# Patient Record
Sex: Male | Born: 1965 | Race: White | Hispanic: No | Marital: Married | Smoking: Former smoker
Health system: Southern US, Community
[De-identification: ages and names within clinical notes are randomized; demographics above are authoritative.]

## PROBLEM LIST (undated history)

## (undated) DIAGNOSIS — I739 Peripheral vascular disease, unspecified: Secondary | ICD-10-CM

## (undated) DIAGNOSIS — M1990 Unspecified osteoarthritis, unspecified site: Secondary | ICD-10-CM

## (undated) DIAGNOSIS — G4730 Sleep apnea, unspecified: Secondary | ICD-10-CM

---

## 1988-10-26 HISTORY — PX: ROTATOR CUFF REPAIR: SHX139

## 2018-11-03 ENCOUNTER — Emergency Department (HOSPITAL_COMMUNITY): Payer: BLUE CROSS/BLUE SHIELD

## 2018-11-03 ENCOUNTER — Inpatient Hospital Stay (HOSPITAL_COMMUNITY): Payer: BLUE CROSS/BLUE SHIELD

## 2018-11-03 ENCOUNTER — Inpatient Hospital Stay (HOSPITAL_COMMUNITY)
Admission: EM | Admit: 2018-11-03 | Discharge: 2018-11-09 | DRG: 418 | Disposition: A | Discharge status: Home / Self Care | Payer: BLUE CROSS/BLUE SHIELD | Attending: Internal Medicine | Admitting: Internal Medicine

## 2018-11-03 ENCOUNTER — Encounter (HOSPITAL_COMMUNITY): Payer: Self-pay

## 2018-11-03 ENCOUNTER — Other Ambulatory Visit: Payer: Self-pay

## 2018-11-03 DIAGNOSIS — K859 Acute pancreatitis without necrosis or infection, unspecified: Secondary | ICD-10-CM

## 2018-11-03 DIAGNOSIS — E876 Hypokalemia: Secondary | ICD-10-CM | POA: Diagnosis not present

## 2018-11-03 DIAGNOSIS — Z87891 Personal history of nicotine dependence: Secondary | ICD-10-CM | POA: Diagnosis not present

## 2018-11-03 DIAGNOSIS — K8020 Calculus of gallbladder without cholecystitis without obstruction: Secondary | ICD-10-CM

## 2018-11-03 DIAGNOSIS — Z6841 Body Mass Index (BMI) 40.0 and over, adult: Secondary | ICD-10-CM

## 2018-11-03 DIAGNOSIS — K851 Biliary acute pancreatitis without necrosis or infection: Principal | ICD-10-CM | POA: Diagnosis present

## 2018-11-03 DIAGNOSIS — K219 Gastro-esophageal reflux disease without esophagitis: Secondary | ICD-10-CM | POA: Diagnosis present

## 2018-11-03 DIAGNOSIS — R252 Cramp and spasm: Secondary | ICD-10-CM | POA: Diagnosis present

## 2018-11-03 DIAGNOSIS — K8000 Calculus of gallbladder with acute cholecystitis without obstruction: Secondary | ICD-10-CM | POA: Diagnosis not present

## 2018-11-03 DIAGNOSIS — R1011 Right upper quadrant pain: Secondary | ICD-10-CM | POA: Diagnosis present

## 2018-11-03 DIAGNOSIS — M1990 Unspecified osteoarthritis, unspecified site: Secondary | ICD-10-CM | POA: Diagnosis present

## 2018-11-03 DIAGNOSIS — K567 Ileus, unspecified: Secondary | ICD-10-CM | POA: Diagnosis not present

## 2018-11-03 DIAGNOSIS — R7989 Other specified abnormal findings of blood chemistry: Secondary | ICD-10-CM

## 2018-11-03 DIAGNOSIS — R109 Unspecified abdominal pain: Secondary | ICD-10-CM

## 2018-11-03 DIAGNOSIS — Z419 Encounter for procedure for purposes other than remedying health state, unspecified: Secondary | ICD-10-CM

## 2018-11-03 DIAGNOSIS — R945 Abnormal results of liver function studies: Secondary | ICD-10-CM | POA: Diagnosis not present

## 2018-11-03 LAB — URINALYSIS, ROUTINE W REFLEX MICROSCOPIC
BILIRUBIN URINE: NEGATIVE
Glucose, UA: NEGATIVE mg/dL
Hgb urine dipstick: NEGATIVE
Ketones, ur: NEGATIVE mg/dL
Leukocytes, UA: NEGATIVE
Nitrite: NEGATIVE
Protein, ur: NEGATIVE mg/dL
Specific Gravity, Urine: 1.023 (ref 1.005–1.030)
pH: 5 (ref 5.0–8.0)

## 2018-11-03 LAB — COMPREHENSIVE METABOLIC PANEL
ALT: 85 U/L — ABNORMAL HIGH (ref 0–44)
AST: 104 U/L — ABNORMAL HIGH (ref 15–41)
Albumin: 4 g/dL (ref 3.5–5.0)
Alkaline Phosphatase: 58 U/L (ref 38–126)
Anion gap: 6 (ref 5–15)
BUN: 23 mg/dL — ABNORMAL HIGH (ref 6–20)
CO2: 23 mmol/L (ref 22–32)
Calcium: 8.7 mg/dL — ABNORMAL LOW (ref 8.9–10.3)
Chloride: 110 mmol/L (ref 98–111)
Creatinine, Ser: 0.83 mg/dL (ref 0.61–1.24)
GFR calc non Af Amer: >60 mL/min (ref >60)
Glucose, Bld: 156 mg/dL — ABNORMAL HIGH (ref 70–99)
Potassium: 3.9 mmol/L (ref 3.5–5.1)
Sodium: 139 mmol/L (ref 135–145)
Total Bilirubin: 1.1 mg/dL (ref 0.3–1.2)
Total Protein: 6.8 g/dL (ref 6.5–8.1)

## 2018-11-03 LAB — CBC
HCT: 46.1 % (ref 39.0–52.0)
HEMATOCRIT: 46.4 % (ref 39.0–52.0)
Hemoglobin: 15.1 g/dL (ref 13.0–17.0)
Hemoglobin: 14.9 g/dL (ref 13.0–17.0)
MCH: 30.8 pg (ref 26.0–34.0)
MCH: 31.2 pg (ref 26.0–34.0)
MCHC: 32.8 g/dL (ref 30.0–36.0)
MCHC: 32.1 g/dL (ref 30.0–36.0)
MCV: 93.9 fL (ref 80.0–100.0)
MCV: 97.3 fL (ref 80.0–100.0)
Platelets: 221 10*3/uL (ref 150–400)
Platelets: 212 10*3/uL (ref 150–400)
RBC: 4.91 MIL/uL (ref 4.22–5.81)
RBC: 4.77 MIL/uL (ref 4.22–5.81)
RDW: 13.2 % (ref 11.5–15.5)
RDW: 13.3 % (ref 11.5–15.5)
WBC: 7.5 10*3/uL (ref 4.0–10.5)
WBC: 10.2 10*3/uL (ref 4.0–10.5)
nRBC: 0 % (ref 0.0–0.2)
nRBC: 0 % (ref 0.0–0.2)

## 2018-11-03 LAB — CREATININE, SERUM
Creatinine, Ser: 0.81 mg/dL (ref 0.61–1.24)
GFR calc Af Amer: >60 mL/min (ref >60)
GFR calc non Af Amer: >60 mL/min (ref >60)

## 2018-11-03 LAB — LIPASE, BLOOD: Lipase: 6447 U/L — ABNORMAL HIGH (ref 11–51)

## 2018-11-03 MED ORDER — SODIUM CHLORIDE (PF) 0.9 % IJ SOLN
INTRAMUSCULAR | Status: AC
  Filled 2018-11-03: qty 50

## 2018-11-03 MED ORDER — IBUPROFEN 400 MG PO TABS
400.0000 mg | ORAL_TABLET | Freq: Four times a day (QID) | ORAL | Status: DC | PRN
  Administered 2018-11-03 – 2018-11-07 (×3): 400 mg via ORAL
  Filled 2018-11-03 (×3): qty 1

## 2018-11-03 MED ORDER — ACETAMINOPHEN 325 MG PO TABS
650.0000 mg | ORAL_TABLET | Freq: Four times a day (QID) | ORAL | Status: DC | PRN
  Administered 2018-11-08: 650 mg via ORAL
  Filled 2018-11-03: qty 2

## 2018-11-03 MED ORDER — ACETAMINOPHEN 650 MG RE SUPP: 650.0000 mg | Freq: Four times a day (QID) | RECTAL | Status: DC | PRN

## 2018-11-03 MED ORDER — LIP MEDEX EX OINT
TOPICAL_OINTMENT | CUTANEOUS | Status: AC
  Administered 2018-11-03: 1
  Filled 2018-11-03: qty 7

## 2018-11-03 MED ORDER — ONDANSETRON HCL 4 MG PO TABS
4.0000 mg | ORAL_TABLET | Freq: Four times a day (QID) | ORAL | Status: DC | PRN
  Filled 2018-11-03: qty 1

## 2018-11-03 MED ORDER — LACTATED RINGERS IV BOLUS
1000.0000 mL | Freq: Once | INTRAVENOUS | Status: AC
  Administered 2018-11-03: 1000 mL via INTRAVENOUS

## 2018-11-03 MED ORDER — TRAMADOL HCL 50 MG PO TABS
50.0000 mg | ORAL_TABLET | Freq: Four times a day (QID) | ORAL | Status: DC | PRN
  Administered 2018-11-03 – 2018-11-07 (×3): 50 mg via ORAL
  Filled 2018-11-03 (×3): qty 1

## 2018-11-03 MED ORDER — LIDOCAINE VISCOUS HCL 2 % MT SOLN
15.0000 mL | Freq: Once | OROMUCOSAL | Status: AC
  Administered 2018-11-03: 15 mL via ORAL
  Filled 2018-11-03: qty 15

## 2018-11-03 MED ORDER — SODIUM CHLORIDE 0.9 % IV SOLN
Freq: Once | INTRAVENOUS | Status: AC
  Administered 2018-11-03: 11:00:00 via INTRAVENOUS

## 2018-11-03 MED ORDER — ALUM & MAG HYDROXIDE-SIMETH 200-200-20 MG/5ML PO SUSP
30.0000 mL | Freq: Once | ORAL | Status: AC
  Administered 2018-11-03: 30 mL via ORAL
  Filled 2018-11-03: qty 30

## 2018-11-03 MED ORDER — ONDANSETRON HCL 4 MG/2ML IJ SOLN
4.0000 mg | Freq: Four times a day (QID) | INTRAMUSCULAR | Status: DC | PRN
  Administered 2018-11-03 – 2018-11-06 (×3): 4 mg via INTRAVENOUS
  Filled 2018-11-03 (×4): qty 2

## 2018-11-03 MED ORDER — SODIUM CHLORIDE 0.9 % IV SOLN
INTRAVENOUS | Status: DC
  Administered 2018-11-03: 150 mL/h via INTRAVENOUS
  Administered 2018-11-03 – 2018-11-04 (×3): via INTRAVENOUS
  Administered 2018-11-04: 150 mL/h via INTRAVENOUS
  Administered 2018-11-04 – 2018-11-07 (×5): via INTRAVENOUS

## 2018-11-03 MED ORDER — HYDROMORPHONE HCL 1 MG/ML IJ SOLN
1.0000 mg | Freq: Once | INTRAMUSCULAR | Status: AC
  Administered 2018-11-03: 1 mg via INTRAVENOUS
  Filled 2018-11-03: qty 1

## 2018-11-03 MED ORDER — FENTANYL CITRATE (PF) 100 MCG/2ML IJ SOLN
50.0000 ug | Freq: Once | INTRAMUSCULAR | Status: AC
  Administered 2018-11-03: 50 ug via INTRAVENOUS
  Filled 2018-11-03: qty 2

## 2018-11-03 MED ORDER — ENOXAPARIN SODIUM 80 MG/0.8ML ~~LOC~~ SOLN
80.0000 mg | SUBCUTANEOUS | Status: DC
  Administered 2018-11-03 – 2018-11-07 (×5): 80 mg via SUBCUTANEOUS
  Filled 2018-11-03 (×7): qty 0.8

## 2018-11-03 MED ORDER — IOPAMIDOL (ISOVUE-300) INJECTION 61%
INTRAVENOUS | Status: AC
  Filled 2018-11-03: qty 100

## 2018-11-03 MED ORDER — SENNOSIDES-DOCUSATE SODIUM 8.6-50 MG PO TABS
1.0000 | ORAL_TABLET | Freq: Every evening | ORAL | Status: DC | PRN
  Administered 2018-11-06: 1 via ORAL

## 2018-11-03 MED ORDER — HYDROMORPHONE HCL 1 MG/ML IJ SOLN
1.0000 mg | INTRAMUSCULAR | Status: DC | PRN
  Administered 2018-11-03 – 2018-11-07 (×30): 1 mg via INTRAVENOUS
  Filled 2018-11-03 (×33): qty 1

## 2018-11-03 MED ORDER — IOPAMIDOL (ISOVUE-300) INJECTION 61%
100.0000 mL | Freq: Once | INTRAVENOUS | Status: AC | PRN
  Administered 2018-11-03: 100 mL via INTRAVENOUS

## 2018-11-03 MED ORDER — ONDANSETRON HCL 4 MG/2ML IJ SOLN
4.0000 mg | Freq: Once | INTRAMUSCULAR | Status: AC
  Administered 2018-11-03: 4 mg via INTRAVENOUS
  Filled 2018-11-03: qty 2

## 2018-11-03 NOTE — ED Provider Notes (Addendum)
[facility] DEPT Provider Note   CSN: [id number] Arrival date & time: 11/03/18  0848     History   Chief Complaint Chief Complaint  Patient presents with  . Abdominal Pain    HPI [name] is a 53 y.o. male.  The history is provided by the patient.  Abdominal Pain  Pain location:  Epigastric and RUQ Pain quality: aching, cramping and dull   Pain radiates to:  Does not radiate Pain severity:  Severe Onset quality:  Gradual Timing:  Constant Progression:  Worsening Chronicity:  New Context: alcohol use (social)   Context: not trauma   Relieved by:  Nothing Worsened by:  Nothing Associated symptoms: anorexia   Associated symptoms: no chest pain, no chills, no constipation, no cough, no diarrhea, no dysuria, no fever, no hematuria, no melena, no nausea, no shortness of breath, no sore throat and no vomiting   Risk factors: no NSAID use     History reviewed. No pertinent past medical history.  Patient Active Problem List   Diagnosis Date Noted  . Pancreatitis 11/03/2018  . Leg cramps 11/03/2018  . Osteoarthritis 11/03/2018    Past Surgical History:  Procedure Laterality Date  . ROTATOR CUFF REPAIR Left 1990        Home Medications    Prior to Admission medications   Medication Sig Start Date End Date Taking? Authorizing Provider  Cholecalciferol (VITAMIN D3) 250 MCG (10000 UT) TABS Take 10,000 Units by mouth daily.   Yes [provider]  famotidine (PEPCID) 20 MG tablet Take 20 mg by mouth daily as needed for heartburn or indigestion.   Yes [provider]  glucosamine-chondroitin 500-400 MG tablet Take 1 tablet by mouth daily as needed (joint pain).   Yes [provider]  ibuprofen (ADVIL,MOTRIN) 200 MG tablet Take 400 mg by mouth every 6 (six) hours as needed for mild pain.   Yes [provider]  magnesium oxide (MAG-OX) 400 MG tablet Take 400 mg by mouth daily.   Yes [provider]     Family History History reviewed. No pertinent family history.  Social History Social History   Tobacco Use  . Smoking status: Former Research scientist (life sciences)  . Smokeless tobacco: Never Used  Substance Use Topics  . Alcohol use: Not Currently  . Drug use: Not Currently     Allergies   Patient has no known allergies.   Review of Systems Review of Systems  Constitutional: Negative for chills and fever.  HENT: Negative for ear pain and sore throat.   Eyes: Negative for pain and visual disturbance.  Respiratory: Negative for cough and shortness of breath.   Cardiovascular: Negative for chest pain and palpitations.  Gastrointestinal: Positive for abdominal distention, abdominal pain and anorexia. Negative for anal bleeding, blood in stool, constipation, diarrhea, melena, nausea, rectal pain and vomiting.  Genitourinary: Negative for dysuria and hematuria.  Musculoskeletal: Negative for arthralgias and back pain.  Skin: Negative for color change and rash.  Neurological: Negative for seizures and syncope.  All other systems reviewed and are negative.    Physical Exam Updated Vital Signs  ED Triage Vitals  Enc Vitals Group     BP 11/03/18 0855 129/80     Pulse Rate 11/03/18 0855 77     Resp 11/03/18 0855 (!) 22     Temp 11/03/18 0905 97.6 F (36.4 C)     Temp Source 11/03/18 0905 Oral     SpO2 11/03/18 0855 97 %  Weight 11/03/18 0855 (!) 378 lb (171.5 kg)     Height 11/03/18 0855 6\' 3"  (1.905 m)     Head Circumference --      Peak Flow --      Pain Score 11/03/18 0859 8     Pain Loc --      Pain Edu? --      Excl. in [place]? --     Physical Exam Vitals signs and nursing note reviewed.  Constitutional:      General: He is in acute distress.     Appearance: He is well-developed.  HENT:     Head: Normocephalic and atraumatic.     Mouth/Throat:     Mouth: Mucous membranes are moist.  Eyes:     Extraocular Movements: Extraocular movements intact.     Conjunctiva/sclera:  Conjunctivae normal.     Pupils: Pupils are equal, round, and reactive to light.  Neck:     Musculoskeletal: Neck supple.  Cardiovascular:     Rate and Rhythm: Normal rate and regular rhythm.     Heart sounds: Normal heart sounds. No murmur.  Pulmonary:     Effort: Pulmonary effort is normal. No respiratory distress.     Breath sounds: Normal breath sounds.  Abdominal:     Palpations: Abdomen is soft.     Tenderness: There is abdominal tenderness in the right upper quadrant and epigastric area. There is guarding. There is no right CVA tenderness, left CVA tenderness or rebound. Negative signs include Murphy's sign and Rovsing's sign.  Skin:    General: Skin is warm and dry.     Capillary Refill: Capillary refill takes less than 2 seconds.  Neurological:     General: No focal deficit present.     Mental Status: He is alert.      ED Treatments / Results  Labs (all labs ordered are listed, but only abnormal results are displayed) Labs Reviewed  LIPASE, BLOOD - Abnormal; Notable for the following components:      Result Value   Lipase 6,447 (*)    All other components within normal limits  COMPREHENSIVE METABOLIC PANEL - Abnormal; Notable for the following components:   Glucose, Bld 156 (*)    BUN 23 (*)    Calcium 8.7 (*)    AST 104 (*)    ALT 85 (*)    All other components within normal limits  CBC  URINALYSIS, ROUTINE W REFLEX MICROSCOPIC    EKG EKG Interpretation  Date/Time:  Thursday November 03 2018 09:26:32 EST Ventricular Rate:  77 PR Interval:    QRS Duration: 99 QT Interval:  387 QTC Calculation: 438 R Axis:   32 Text Interpretation:  Sinus rhythm Low voltage, precordial leads Confirmed by Lennice Sites [phone]) on 11/03/2018 9:32:01 AM   Radiology Dg Chest 1 View  Result Date: 11/03/2018 CLINICAL DATA:  Epigastric abdominal pain. EXAM: CHEST  1 VIEW COMPARISON:  None. FINDINGS: The heart size and mediastinal contours are within normal limits. Both lungs  are clear. No pneumothorax or pleural effusion is noted. The visualized skeletal structures are unremarkable. IMPRESSION: No active disease. Electronically Signed   By: Marijo Conception, M.D.   On: 11/03/2018 10:26   Ct Abdomen Pelvis W Contrast  Result Date: 11/03/2018 CLINICAL DATA:  Abdominal pain and distention today EXAM: CT ABDOMEN AND PELVIS WITH CONTRAST TECHNIQUE: Multidetector CT imaging of the abdomen and pelvis was performed using the standard protocol following bolus administration of intravenous contrast. CONTRAST:  150mL  ISOVUE-300 IOPAMIDOL (ISOVUE-300) INJECTION 61% COMPARISON:  None. FINDINGS: Lower chest: The lung bases are clear. The heart is within normal limits in size. No pericardial effusion is seen. There is a moderate amount of epicardial fat present. Hepatobiliary: The liver enhances with no focal abnormality. No ductal dilatation is seen. The gallbladder is well seen with no calcified gallstones noted. Pancreas: There is strandiness around the descending duodenum and head of the pancreas with some fluid layering in the anterior pararenal space extending caudally. These findings are most consistent with pancreatitis although and adjacent descending duodenal duct ulceration can not be excluded. There is some prominence of the common bile duct most likely due to the adjacent inflammatory process. The pancreatic duct is not dilated. Some fluid also is present layering in the anterior pararenal space on the left. No definite mass is seen. Fluid also extends into the extraperitoneal spaces anteriorly. Spleen: The spleen is unremarkable. Adrenals/Urinary Tract: The adrenal glands appear normal. The kidneys enhance with no calculus or mass and on delayed images, no hydronephrosis is seen. The ureters appear normal in caliber. The urinary bladder is moderately distended with no abnormality noted. Stomach/Bowel: The stomach is largely decompressed with no significant abnormality other than the  inflammation surrounding the descending duodenum possibly related to ulceration or adjacent pancreatitis. No small bowel distention or edema is seen. The colon is largely decompressed. The terminal ileum and the appendix are unremarkable. Vascular/Lymphatic: No vascular abnormality is seen. No adenopathy is noted. A 9 mm para-aortic node is present on image 69 series 2. Reproductive: The prostate is normal in size. Other: There is a small umbilical hernia containing only fat. Musculoskeletal: The lumbar vertebrae are in normal alignment with normal intervertebral disc spaces. The SI joints appear well corticated. IMPRESSION: 1. Inflammatory process in the region of the head of the pancreas and descending duodenum most consistent with acute pancreatitis with pancreatic exudate. A descending duodenal ulceration would be difficult to exclude. However pancreatitis is favored as the etiology. 2. The appendix and terminal ileum are unremarkable. 3. Small umbilical hernia containing only fat. Electronically Signed   By: Ivar Drape M.D.   On: 11/03/2018 11:04    Procedures Procedures (including critical care time)  Medications Ordered in ED Medications  iopamidol (ISOVUE-300) 61 % injection (has no administration in time range)  sodium chloride (PF) 0.9 % injection (has no administration in time range)  lactated ringers bolus 1,000 mL (0 mLs Intravenous Stopped 11/03/18 1111)  ondansetron (ZOFRAN) injection 4 mg (4 mg Intravenous Given 11/03/18 0933)  fentaNYL (SUBLIMAZE) injection 50 mcg (50 mcg Intravenous Given 11/03/18 0933)  alum & mag hydroxide-simeth (MAALOX/MYLANTA) 200-200-20 MG/5ML suspension 30 mL (30 mLs Oral Given 11/03/18 1108)    And  lidocaine (XYLOCAINE) 2 % viscous mouth solution 15 mL (15 mLs Oral Given 11/03/18 1108)  iopamidol (ISOVUE-300) 61 % injection 100 mL (100 mLs Intravenous Contrast Given 11/03/18 1043)  0.9 %  sodium chloride infusion ( Intravenous New Bag/Given 11/03/18 1106)    HYDROmorphone (DILAUDID) injection 1 mg (1 mg Intravenous Given 11/03/18 1152)     Initial Impression / Assessment and Plan / ED Course  I have reviewed the triage vital signs and the nursing notes.  Pertinent labs & imaging results that were available during my care of the patient were reviewed by me and considered in my medical decision making (see chart for details).     [name] is a 53 year old male with no significant medical history who presents to the  ED with abdominal pain.  Patient with normal vitals.  No fever.  Patient mostly epigastric, right upper quadrant abdominal pain for the last several hours.  Does not have a history of the same.  Social alcohol user.  No history of abdominal surgery.  Patient has had normal bowel movements, no pain with urination.  Patient appears to be uncomfortable on exam.  Has tenderness in the epigastric region, right upper quadrant.  Has some guarding.  No signs of obvious peritonitis.  Denies any black stools, vomiting, nausea.  Will obtain lab work to look for any gallbladder, pancreas process.  Possible liver process as well.  Will obtain CT scan to evaluate for obstruction, other intra-abdominal process.  Patient given IV fluids, IV fentanyl, IV Zofran.  Patient with lipase in the 6000.  Biliary enzymes normal.  Mild elevation of AST and ALT.  CT scan shows pancreatitis.  Patient otherwise with no significant leukocytosis, electrolyte abnormality, kidney injury.  Patient with new diagnosis of pancreatitis.  To be admitted to hospitalist for further care.  Does not appear to be related to gallbladder issues but may benefit from right upper quadrant ultrasound.  Possible from alcohol abuse although patient states that he only socially drinks.  Hemodynamically stable for my care and admitted in stable condition.  This chart was dictated using voice recognition software.  Despite best efforts to proofread,  errors can occur which can change the  documentation meaning.   Final Clinical Impressions(s) / ED Diagnoses   Final diagnoses:  Acute pancreatitis, unspecified complication status, unspecified pancreatitis type    ED Discharge Orders    None       Lennice Sites, DO 11/03/18 [place], [place], DO 11/03/18 1211

## 2018-11-03 NOTE — H&P (Signed)
History and Physical    [name] MBW:[id number] DOB: 1966-07-24 DOA: 11/03/2018  PCP: Patient, No Pcp Per patient, reports they just moved here from [place] Patient coming from: Home  I have personally briefly reviewed patient's old medical records in [facility]  Chief Complaint: Abdominal pain  HPI: [name] is a 53 y.o. male with medical history significant leg cramping and osteoarthritis with no other major medical problems who presents to the ER with acute onset abdominal pain.  Patient reports it came on last evening and as a result of its persistence and worsening he presented to the ER.  In the ER he reported pain came on last night without any precipitating event.  The pain was severe and radiated to ruq and epigastric area.  He reported no change sin his health with normal bowel movements no urinary symptoms, no abdominal surgery, social alcohol 1 drink 2 days ago and previous to that 2 weeks ago, He also reports a normal cholesterol profile in the past.  His work-up in the ER was consistent with acute pancreatitis with a lipase of 6000 and was consulted to the hospital service.  ED Course: Patient was hemodynamically stable in the ER was recent vital signs showing a blood pressure showing 126/80, pulse 80, respirations 24 satting 99%.  Patient's lipase is greater than 6000 and CT of abdomen pelvis notable for an inflammatory process in the region of the head of the pancreas and descending duodenum most consistent with acute pancreatitis on the differential although unlikely was duodenal ulceration patient received fentanyl and Dilaudid as well as fluids at 125 mL/h.  Review of Systems: As per HPI otherwise 10 point review of systems negative.    History reviewed. No pertinent past medical history.  Past Surgical History:  Procedure Laterality Date  . ROTATOR CUFF REPAIR Left 1990     reports that he has quit smoking. He has never used smokeless tobacco. He reports  previous alcohol use. He reports previous drug use.  No Known Allergies  History reviewed. No pertinent family history.   Prior to Admission medications   Medication Sig Start Date End Date Taking? Authorizing Provider  Cholecalciferol (VITAMIN D3) 250 MCG (10000 UT) TABS Take 10,000 Units by mouth daily.   Yes [provider]  famotidine (PEPCID) 20 MG tablet Take 20 mg by mouth daily as needed for heartburn or indigestion.   Yes [provider]  glucosamine-chondroitin 500-400 MG tablet Take 1 tablet by mouth daily as needed (joint pain).   Yes [provider]  ibuprofen (ADVIL,MOTRIN) 200 MG tablet Take 400 mg by mouth every 6 (six) hours as needed for mild pain.   Yes [provider]  magnesium oxide (MAG-OX) 400 MG tablet Take 400 mg by mouth daily.   Yes [provider]    Physical Exam: Vitals:   11/03/18 0855 11/03/18 0905 11/03/18 1000 11/03/18 1115  BP: 129/80  121/84 126/80  Pulse: 77  75 80  Resp: (!) 22  17 (!) 24  Temp:  97.6 F (36.4 C)    TempSrc:  Oral    SpO2: 97%  95% 99%  Weight: (!) 171.5 kg     Height: 6' 3"  (1.905 m)       Constitutional: NAD, calm, comfortable Vitals:   11/03/18 0855 11/03/18 0905 11/03/18 1000 11/03/18 1115  BP: 129/80  121/84 126/80  Pulse: 77  75 80  Resp: (!) 22  17 (!) 24  Temp:  97.6 F (36.4  C)    TempSrc:  Oral    SpO2: 97%  95% 99%  Weight: (!) 171.5 kg     Height: 6' 3"  (1.905 m)      Eyes: PERRL, lids and conjunctivae normal ENMT: Mucous membranes are moist. Posterior pharynx clear of any exudate or lesions.Normal dentition.  Neck: normal, supple, no masses, no thyromegaly Respiratory: clear to auscultation bilaterally, no wheezing, no crackles. Normal respiratory effort. No accessory muscle use.  Cardiovascular: Regular rate and rhythm, no murmurs / rubs / gallops. No extremity edema. 2+ pedal pulses. No carotid bruits.  Abdomen: Tender to palpation epigastric and right  upper quadrant, quiet bowel sounds not distended not rigid Musculoskeletal: no clubbing / cyanosis. No joint deformity upper and lower extremities. Good ROM, no contractures. Normal muscle tone.  Skin: no rashes, lesions, ulcers. No induration Neurologic: CN 2-12 grossly intact. Sensation intact, DTR normal. Strength 5/5 in all 4.  Psychiatric: Normal judgment and insight. Alert and oriented x 3. Normal mood.     Labs on Admission: I have personally reviewed following labs and imaging studies  CBC: Recent Labs  Lab 11/03/18 0931  WBC 7.5  HGB 15.1  HCT 46.1  MCV 93.9  PLT 027   Basic Metabolic Panel: Recent Labs  Lab 11/03/18 0931  NA 139  K 3.9  CL 110  CO2 23  GLUCOSE 156*  BUN 23*  CREATININE 0.83  CALCIUM 8.7*   GFR: Estimated Creatinine Clearance: 179.7 mL/min (by C-G formula based on SCr of 0.83 mg/dL). Liver Function Tests: Recent Labs  Lab 11/03/18 0931  AST 104*  ALT 85*  ALKPHOS 58  BILITOT 1.1  PROT 6.8  ALBUMIN 4.0   Recent Labs  Lab 11/03/18 0931  LIPASE 6,447*   No results for input(s): AMMONIA in the last 168 hours. Coagulation Profile: No results for input(s): INR, PROTIME in the last 168 hours. Cardiac Enzymes: No results for input(s): CKTOTAL, CKMB, CKMBINDEX, TROPONINI in the last 168 hours. BNP (last 3 results) No results for input(s): PROBNP in the last 8760 hours. HbA1C: No results for input(s): HGBA1C in the last 72 hours. CBG: No results for input(s): GLUCAP in the last 168 hours. Lipid Profile: No results for input(s): CHOL, HDL, LDLCALC, TRIG, CHOLHDL, LDLDIRECT in the last 72 hours. Thyroid Function Tests: No results for input(s): TSH, T4TOTAL, FREET4, T3FREE, THYROIDAB in the last 72 hours. Anemia Panel: No results for input(s): VITAMINB12, FOLATE, FERRITIN, TIBC, IRON, RETICCTPCT in the last 72 hours. Urine analysis: No results found for: COLORURINE, APPEARANCEUR, LABSPEC, PHURINE, GLUCOSEU, HGBUR, BILIRUBINUR,  KETONESUR, PROTEINUR, UROBILINOGEN, NITRITE, LEUKOCYTESUR  Radiological Exams on Admission: Dg Chest 1 View  Result Date: 11/03/2018 CLINICAL DATA:  Epigastric abdominal pain. EXAM: CHEST  1 VIEW COMPARISON:  None. FINDINGS: The heart size and mediastinal contours are within normal limits. Both lungs are clear. No pneumothorax or pleural effusion is noted. The visualized skeletal structures are unremarkable. IMPRESSION: No active disease. Electronically Signed   By: Marijo Conception, M.D.   On: 11/03/2018 10:26   Ct Abdomen Pelvis W Contrast  Result Date: 11/03/2018 CLINICAL DATA:  Abdominal pain and distention today EXAM: CT ABDOMEN AND PELVIS WITH CONTRAST TECHNIQUE: Multidetector CT imaging of the abdomen and pelvis was performed using the standard protocol following bolus administration of intravenous contrast. CONTRAST:  14m ISOVUE-300 IOPAMIDOL (ISOVUE-300) INJECTION 61% COMPARISON:  None. FINDINGS: Lower chest: The lung bases are clear. The heart is within normal limits in size. No pericardial effusion is seen. There  is a moderate amount of epicardial fat present. Hepatobiliary: The liver enhances with no focal abnormality. No ductal dilatation is seen. The gallbladder is well seen with no calcified gallstones noted. Pancreas: There is strandiness around the descending duodenum and head of the pancreas with some fluid layering in the anterior pararenal space extending caudally. These findings are most consistent with pancreatitis although and adjacent descending duodenal duct ulceration can not be excluded. There is some prominence of the common bile duct most likely due to the adjacent inflammatory process. The pancreatic duct is not dilated. Some fluid also is present layering in the anterior pararenal space on the left. No definite mass is seen. Fluid also extends into the extraperitoneal spaces anteriorly. Spleen: The spleen is unremarkable. Adrenals/Urinary Tract: The adrenal glands appear  normal. The kidneys enhance with no calculus or mass and on delayed images, no hydronephrosis is seen. The ureters appear normal in caliber. The urinary bladder is moderately distended with no abnormality noted. Stomach/Bowel: The stomach is largely decompressed with no significant abnormality other than the inflammation surrounding the descending duodenum possibly related to ulceration or adjacent pancreatitis. No small bowel distention or edema is seen. The colon is largely decompressed. The terminal ileum and the appendix are unremarkable. Vascular/Lymphatic: No vascular abnormality is seen. No adenopathy is noted. A 9 mm para-aortic node is present on image 69 series 2. Reproductive: The prostate is normal in size. Other: There is a small umbilical hernia containing only fat. Musculoskeletal: The lumbar vertebrae are in normal alignment with normal intervertebral disc spaces. The SI joints appear well corticated. IMPRESSION: 1. Inflammatory process in the region of the head of the pancreas and descending duodenum most consistent with acute pancreatitis with pancreatic exudate. A descending duodenal ulceration would be difficult to exclude. However pancreatitis is favored as the etiology. 2. The appendix and terminal ileum are unremarkable. 3. Small umbilical hernia containing only fat. Electronically Signed   By: Ivar Drape M.D.   On: 11/03/2018 11:04    EKG: Independently reviewed.  Sinus rhythm no acute ST changes  Assessment/Plan Principal Problem:   Pancreatitis Active Problems:   Leg cramps   Osteoarthritis    Acute pancreatitis known etiology.  We will keep the patient n.p.o., recheck a lipase in the morning, check a lipid profile, check a right upper quadrant ultrasound although bili and alk phos are normal, aggressive fluid resuscitation and analgesics.  Leg cramps.  Patient reports leg cramps at home for which he takes magnesium.  Will check a mag level with morning labs.  We will hold  his p.o. mag given his acute pancreatitis.  Osteoarthritis again resume patient's pain and treatment at home on discharge  DVT prophylaxis: lovenox Code Status: full Family Communication: Discussed plan of care with patient and wife at bedside they are in agreement with plan for admission and treatment for acute onset pancreatitis Disposition Plan: to home 2-3 days Consults called: none Admission status: Inpatient MedSurg   Nicolette Bang MD [facility] If 7PM-7AM, please contact night-coverage   11/03/2018, 12:13 PM

## 2018-11-03 NOTE — ED Triage Notes (Signed)
Patient c/o mid abdominal pain since 0730 today. patient denies any N/v/d.

## 2018-11-03 NOTE — ED Notes (Signed)
Admitting provider bedside 

## 2018-11-03 NOTE — ED Notes (Signed)
PT have been made aware of urine sample.Will info staff when able to provide

## 2018-11-03 NOTE — ED Notes (Signed)
ED TO INPATIENT HANDOFF REPORT  Name/Age/Gender [name] 53 y.o. male  Code Status   Home/SNF/Other Home  Chief Complaint stomach pain  Level of Care/Admitting Diagnosis ED Disposition    ED Disposition Condition [facility] Area: [place] [[id number]]  Level of Care: Med-Surg [16]  Diagnosis: Pancreatitis [176160]  Admitting Physician: Marcell Anger [[id number]]  Attending Physician: Marcell Anger [[id number]]  Estimated length of stay: past midnight tomorrow  Certification:: I certify this patient will need inpatient services for at least 2 midnights  PT Class (Do Not Modify): Inpatient [101]  PT Acc Code (Do Not Modify): Private [1]       Medical History History reviewed. No pertinent past medical history.  Allergies No Known Allergies  IV Location/Drains/Wounds Patient Lines/Drains/Airways Status   Active Line/Drains/Airways    Name:   Placement date:   Placement time:   Site:   Days:   Peripheral IV 11/03/18 Right Wrist   11/03/18    0931    Wrist   less than 1          Labs/Imaging Results for orders placed or performed during the hospital encounter of 11/03/18 (from the past 48 hour(s))  Lipase, blood     Status: Abnormal   Collection Time: 11/03/18  9:31 AM  Result Value Ref Range   Lipase 6,447 (H) 11 - 51 U/L    Comment: RESULTS CONFIRMED BY MANUAL DILUTION Performed at [facility], [place] [address], [place], [place] [address]   Comprehensive metabolic panel     Status: Abnormal   Collection Time: 11/03/18  9:31 AM  Result Value Ref Range   Sodium 139 135 - 145 mmol/L   Potassium 3.9 3.5 - 5.1 mmol/L   Chloride 110 98 - 111 mmol/L   CO2 23 22 - 32 mmol/L   Glucose, Bld 156 (H) 70 - 99 mg/dL   BUN 23 (H) 6 - 20 mg/dL   Creatinine, Ser 0.83 0.61 - 1.24 mg/dL   Calcium 8.7 (L) 8.9 - 10.3 mg/dL   Total Protein 6.8 6.5 - 8.1 g/dL   Albumin 4.0 3.5 - 5.0 g/dL   AST 104 (H) 15 -  41 U/L   ALT 85 (H) 0 - 44 U/L   Alkaline Phosphatase 58 38 - 126 U/L   Total Bilirubin 1.1 0.3 - 1.2 mg/dL   GFR calc non Af Amer >60 >60 mL/min   GFR calc Af Amer >60 >60 mL/min   Anion gap 6 5 - 15    Comment: Performed at [facility], [place] [address] [place], [place] [address]  CBC     Status: None   Collection Time: 11/03/18  9:31 AM  Result Value Ref Range   WBC 7.5 4.0 - 10.5 K/uL   RBC 4.91 4.22 - 5.81 MIL/uL   Hemoglobin 15.1 13.0 - 17.0 g/dL   HCT 46.1 39.0 - 52.0 %   MCV 93.9 80.0 - 100.0 fL   MCH 30.8 26.0 - 34.0 pg   MCHC 32.8 30.0 - 36.0 g/dL   RDW 13.2 11.5 - 15.5 %   Platelets 221 150 - 400 K/uL   nRBC 0.0 0.0 - 0.2 %    Comment: Performed at [facility], [place] [address] [place], [place] [address]   Dg Chest 1 View  Result Date: 11/03/2018 CLINICAL DATA:  Epigastric abdominal pain. EXAM: CHEST  1 VIEW COMPARISON:  None. FINDINGS: The heart  size and mediastinal contours are within normal limits. Both lungs are clear. No pneumothorax or pleural effusion is noted. The visualized skeletal structures are unremarkable. IMPRESSION: No active disease. Electronically Signed   By: Marijo Conception, M.D.   On: 11/03/2018 10:26   Ct Abdomen Pelvis W Contrast  Result Date: 11/03/2018 CLINICAL DATA:  Abdominal pain and distention today EXAM: CT ABDOMEN AND PELVIS WITH CONTRAST TECHNIQUE: Multidetector CT imaging of the abdomen and pelvis was performed using the standard protocol following bolus administration of intravenous contrast. CONTRAST:  12mL ISOVUE-300 IOPAMIDOL (ISOVUE-300) INJECTION 61% COMPARISON:  None. FINDINGS: Lower chest: The lung bases are clear. The heart is within normal limits in size. No pericardial effusion is seen. There is a moderate amount of epicardial fat present. Hepatobiliary: The liver enhances with no focal abnormality. No ductal dilatation is seen. The gallbladder is well seen with no calcified gallstones noted.  Pancreas: There is strandiness around the descending duodenum and head of the pancreas with some fluid layering in the anterior pararenal space extending caudally. These findings are most consistent with pancreatitis although and adjacent descending duodenal duct ulceration can not be excluded. There is some prominence of the common bile duct most likely due to the adjacent inflammatory process. The pancreatic duct is not dilated. Some fluid also is present layering in the anterior pararenal space on the left. No definite mass is seen. Fluid also extends into the extraperitoneal spaces anteriorly. Spleen: The spleen is unremarkable. Adrenals/Urinary Tract: The adrenal glands appear normal. The kidneys enhance with no calculus or mass and on delayed images, no hydronephrosis is seen. The ureters appear normal in caliber. The urinary bladder is moderately distended with no abnormality noted. Stomach/Bowel: The stomach is largely decompressed with no significant abnormality other than the inflammation surrounding the descending duodenum possibly related to ulceration or adjacent pancreatitis. No small bowel distention or edema is seen. The colon is largely decompressed. The terminal ileum and the appendix are unremarkable. Vascular/Lymphatic: No vascular abnormality is seen. No adenopathy is noted. A 9 mm para-aortic node is present on image 69 series 2. Reproductive: The prostate is normal in size. Other: There is a small umbilical hernia containing only fat. Musculoskeletal: The lumbar vertebrae are in normal alignment with normal intervertebral disc spaces. The SI joints appear well corticated. IMPRESSION: 1. Inflammatory process in the region of the head of the pancreas and descending duodenum most consistent with acute pancreatitis with pancreatic exudate. A descending duodenal ulceration would be difficult to exclude. However pancreatitis is favored as the etiology. 2. The appendix and terminal ileum are  unremarkable. 3. Small umbilical hernia containing only fat. Electronically Signed   By: Ivar Drape M.D.   On: 11/03/2018 11:04    Pending Labs Unresulted Labs (From admission, onward)    Start     Ordered   11/04/18 0500  Lipid panel  Tomorrow morning,   R     11/03/18 1229   11/04/18 0500  Magnesium  Tomorrow morning,   R     11/03/18 1229   11/03/18 0904  Urinalysis, Routine w reflex microscopic  ONCE - STAT,   STAT     11/03/18 0903   Signed and Held  HIV antibody (Routine Testing)  Once,   R     Signed and Held   Signed and Held  CBC  (enoxaparin (LOVENOX)    CrCl >/= 30 ml/min)  Once,   R    Comments:  Baseline for enoxaparin therapy IF NOT  ALREADY DRAWN.  Notify MD if PLT < 100 K.    Signed and Held   Signed and Held  Creatinine, serum  (enoxaparin (LOVENOX)    CrCl >/= 30 ml/min)  Once,   R    Comments:  Baseline for enoxaparin therapy IF NOT ALREADY DRAWN.    Signed and Held   Signed and Held  Creatinine, serum  (enoxaparin (LOVENOX)    CrCl >/= 30 ml/min)  Weekly,   R    Comments:  while on enoxaparin therapy    Signed and Held   Signed and Held  Basic metabolic panel  Tomorrow morning,   R     Signed and Held   Signed and Held  Lipase, blood  Tomorrow morning,   R     Signed and Held          Vitals/Pain Today's Vitals   11/03/18 1300 11/03/18 1315 11/03/18 1330 11/03/18 1345  BP:   (!) 143/82   Pulse: 61 75 73 86  Resp: 13 20 17 17   Temp:      TempSrc:      SpO2: 91% 93% 93% 99%  Weight:      Height:      PainSc:        Isolation Precautions No active isolations  Medications Medications  iopamidol (ISOVUE-300) 61 % injection (has no administration in time range)  sodium chloride (PF) 0.9 % injection (has no administration in time range)  lactated ringers bolus 1,000 mL (0 mLs Intravenous Stopped 11/03/18 1111)  ondansetron (ZOFRAN) injection 4 mg (4 mg Intravenous Given 11/03/18 0933)  fentaNYL (SUBLIMAZE) injection 50 mcg (50 mcg Intravenous Given  11/03/18 0933)  alum & mag hydroxide-simeth (MAALOX/MYLANTA) 200-200-20 MG/5ML suspension 30 mL (30 mLs Oral Given 11/03/18 1108)    And  lidocaine (XYLOCAINE) 2 % viscous mouth solution 15 mL (15 mLs Oral Given 11/03/18 1108)  iopamidol (ISOVUE-300) 61 % injection 100 mL (100 mLs Intravenous Contrast Given 11/03/18 1043)  0.9 %  sodium chloride infusion ( Intravenous New Bag/Given 11/03/18 1106)  HYDROmorphone (DILAUDID) injection 1 mg (1 mg Intravenous Given 11/03/18 1152)    Mobility walks

## 2018-11-04 LAB — LIPID PANEL
Cholesterol: 153 mg/dL (ref 0–200)
HDL: 47 mg/dL (ref >40)
LDL Cholesterol: 97 mg/dL (ref 0–99)
Total CHOL/HDL Ratio: 3.3 RATIO
Triglycerides: 47 mg/dL (ref <150)
VLDL: 9 mg/dL (ref 0–40)

## 2018-11-04 LAB — BASIC METABOLIC PANEL
Anion gap: 6 (ref 5–15)
BUN: 18 mg/dL (ref 6–20)
CO2: 25 mmol/L (ref 22–32)
Calcium: 8.5 mg/dL — ABNORMAL LOW (ref 8.9–10.3)
Chloride: 107 mmol/L (ref 98–111)
Creatinine, Ser: 0.8 mg/dL (ref 0.61–1.24)
GFR calc Af Amer: >60 mL/min (ref >60)
GFR calc non Af Amer: >60 mL/min (ref >60)
GLUCOSE: 143 mg/dL — AB (ref 70–99)
Potassium: 4 mmol/L (ref 3.5–5.1)
Sodium: 138 mmol/L (ref 135–145)

## 2018-11-04 LAB — LIPASE, BLOOD: Lipase: 590 U/L — ABNORMAL HIGH (ref 11–51)

## 2018-11-04 LAB — HIV ANTIBODY (ROUTINE TESTING W REFLEX): HIV Screen 4th Generation wRfx: NONREACTIVE

## 2018-11-04 LAB — MAGNESIUM: MAGNESIUM: 2.1 mg/dL (ref 1.7–2.4)

## 2018-11-04 NOTE — Plan of Care (Signed)
Plan of care reviewed and discussed with the patient and SO. Questions answered to their satisfaction.

## 2018-11-04 NOTE — Progress Notes (Signed)
PROGRESS NOTE  [name] VXB:[id number] DOB: 1966-06-03 DOA: 11/03/2018 PCP: Patient, No Pcp Per   LOS: 1 day   Brief Narrative / Interim history: 53 year old male with no significant past medical history admitted with idiopathic acute pancreatitis without complication. Lipase elevated to 6000.  CT abdomen and pelvis showed inflammatory process in the region of the head of the pancreas and descending duodenum most consistent with acute pancreatitis without fluid collection.   Subjective: Reports some improvement in his pain.  Rates his pain as 5/10 this morning.  Had an episode of nonbloody nonbilious emesis this morning.  Reports occasional alcohol use.  Denies previous history of pancreatitis.  Assessment & Plan: Principal Problem:   Pancreatitis Active Problems:   Leg cramps   Osteoarthritis  Idiopathic acute pancreatitis: Noted on CT abdomen.   RUQ ultrasound with cholelithiasis but no CBD or choledocholithiasis.  Lipid panel not impressive.  Abdominal pain improving. Lipase downtrending.  Hemodynamically stable. -Continue IV fluid -Continue pain control -Start clear liquid diet -Continue trending lipase-down trending -Follow electrolytes- stable. -Zofran for nausea and emesis.  Osteoarthritis:  -Pain medications as above -Resume home medication on discharge  Morbid obesity: Body mass index is 47.25 kg/m. -Counsel on lifestyle change  Scheduled Meds: . enoxaparin (LOVENOX) injection  80 mg Subcutaneous Q24H   Continuous Infusions: . sodium chloride 150 mL/hr at 11/04/18 0430   PRN Meds:.acetaminophen **OR** acetaminophen, HYDROmorphone (DILAUDID) injection, ibuprofen, ondansetron **OR** ondansetron (ZOFRAN) IV, senna-docusate, traMADol  DVT prophylaxis: Lovenox Code Status: Full code Family Communication: No family member at bedside. Disposition Plan: Remains inpatient pending p.o. tolerance and improvement in his acute pancreatitis.  Consultants:    None  Procedures:   None  Antimicrobials:  None  Objective: Vitals:   11/03/18 1345 11/03/18 1430 11/03/18 2214 11/04/18 0535  BP:  125/77 124/71 (!) 142/80  Pulse: 86 70 68 (!) 107  Resp: 17 16 17 16   Temp:  97.6 F (36.4 C) (!) 97.5 F (36.4 C) (!) 97.5 F (36.4 C)  TempSrc:  Oral Oral Oral  SpO2: 99% 97% 99% 97%  Weight:  (!) 171.5 kg    Height:  6\' 3"  (1.905 m)      Intake/Output Summary (Last 24 hours) at 11/04/2018 1004 Last data filed at 11/04/2018 0815 Gross per 24 hour  Intake 3205.41 ml  Output 1300 ml  Net 1905.41 ml   Filed Weights   11/03/18 0855 11/03/18 1430  Weight: (!) 171.5 kg (!) 171.5 kg    Examination:  GENERAL: Appears well. No acute distress.  HEENT: MMM.  Vision and Hearing grossly intact.  NECK: Supple.  No JVD.  LUNGS:  No IWOB. Good air movement. CTAB.  HEART:  RRR. Heart sounds normal.  ABD: Bowel sounds present.  Some tenderness to palpation across upper abdomen.  No guarding or rebound. MSK/EXT:  no edema bilaterally.  SKIN: no apparent skin lesion.  NEURO: Awake, alert and oriented appropriately.  No gross deficit.  PSYCH: Calm. Normal affect.   Data Reviewed: I have independently reviewed following labs and imaging studies  CBC: Recent Labs  Lab 11/03/18 0931 11/03/18 1532  WBC 7.5 10.2  HGB 15.1 14.9  HCT 46.1 46.4  MCV 93.9 97.3  PLT 221 330   Basic Metabolic Panel: Recent Labs  Lab 11/03/18 0931 11/03/18 1532 11/04/18 0523  NA 139  --  138  K 3.9  --  4.0  CL 110  --  107  CO2 23  --  25  GLUCOSE 156*  --  143*  BUN 23*  --  18  CREATININE 0.83 0.81 0.80  CALCIUM 8.7*  --  8.5*  MG  --   --  2.1   GFR: Estimated Creatinine Clearance: 186.4 mL/min (by C-G formula based on SCr of 0.8 mg/dL). Liver Function Tests: Recent Labs  Lab 11/03/18 0931  AST 104*  ALT 85*  ALKPHOS 58  BILITOT 1.1  PROT 6.8  ALBUMIN 4.0   Recent Labs  Lab 11/03/18 0931 11/04/18 0523  LIPASE 6,447* 590*   No  results for input(s): AMMONIA in the last 168 hours. Coagulation Profile: No results for input(s): INR, PROTIME in the last 168 hours. Cardiac Enzymes: No results for input(s): CKTOTAL, CKMB, CKMBINDEX, TROPONINI in the last 168 hours. BNP (last 3 results) No results for input(s): PROBNP in the last 8760 hours. HbA1C: No results for input(s): HGBA1C in the last 72 hours. CBG: No results for input(s): GLUCAP in the last 168 hours. Lipid Profile: Recent Labs    11/04/18 0523  CHOL 153  HDL 47  LDLCALC 97  TRIG 47  CHOLHDL 3.3   Thyroid Function Tests: No results for input(s): TSH, T4TOTAL, FREET4, T3FREE, THYROIDAB in the last 72 hours. Anemia Panel: No results for input(s): VITAMINB12, FOLATE, FERRITIN, TIBC, IRON, RETICCTPCT in the last 72 hours. Urine analysis:    Component Value Date/Time   COLORURINE YELLOW 11/03/2018 1400   APPEARANCEUR CLEAR 11/03/2018 1400   LABSPEC 1.023 11/03/2018 1400   PHURINE 5.0 11/03/2018 1400   GLUCOSEU NEGATIVE 11/03/2018 1400   HGBUR NEGATIVE 11/03/2018 1400   BILIRUBINUR NEGATIVE 11/03/2018 1400   KETONESUR NEGATIVE 11/03/2018 1400   PROTEINUR NEGATIVE 11/03/2018 1400   NITRITE NEGATIVE 11/03/2018 1400   LEUKOCYTESUR NEGATIVE 11/03/2018 1400   Sepsis Labs: Invalid input(s): PROCALCITONIN, LACTICIDVEN  No results found for this or any previous visit (from the past 240 hour(s)).    Radiology Studies: Dg Chest 1 View  Result Date: 11/03/2018 CLINICAL DATA:  Epigastric abdominal pain. EXAM: CHEST  1 VIEW COMPARISON:  None. FINDINGS: The heart size and mediastinal contours are within normal limits. Both lungs are clear. No pneumothorax or pleural effusion is noted. The visualized skeletal structures are unremarkable. IMPRESSION: No active disease. Electronically Signed   By: Marijo Conception, M.D.   On: 11/03/2018 10:26   Ct Abdomen Pelvis W Contrast  Result Date: 11/03/2018 CLINICAL DATA:  Abdominal pain and distention today EXAM: CT  ABDOMEN AND PELVIS WITH CONTRAST TECHNIQUE: Multidetector CT imaging of the abdomen and pelvis was performed using the standard protocol following bolus administration of intravenous contrast. CONTRAST:  156mL ISOVUE-300 IOPAMIDOL (ISOVUE-300) INJECTION 61% COMPARISON:  None. FINDINGS: Lower chest: The lung bases are clear. The heart is within normal limits in size. No pericardial effusion is seen. There is a moderate amount of epicardial fat present. Hepatobiliary: The liver enhances with no focal abnormality. No ductal dilatation is seen. The gallbladder is well seen with no calcified gallstones noted. Pancreas: There is strandiness around the descending duodenum and head of the pancreas with some fluid layering in the anterior pararenal space extending caudally. These findings are most consistent with pancreatitis although and adjacent descending duodenal duct ulceration can not be excluded. There is some prominence of the common bile duct most likely due to the adjacent inflammatory process. The pancreatic duct is not dilated. Some fluid also is present layering in the anterior pararenal space on the left. No definite mass is seen. Fluid also extends into the extraperitoneal spaces anteriorly. Spleen:  The spleen is unremarkable. Adrenals/Urinary Tract: The adrenal glands appear normal. The kidneys enhance with no calculus or mass and on delayed images, no hydronephrosis is seen. The ureters appear normal in caliber. The urinary bladder is moderately distended with no abnormality noted. Stomach/Bowel: The stomach is largely decompressed with no significant abnormality other than the inflammation surrounding the descending duodenum possibly related to ulceration or adjacent pancreatitis. No small bowel distention or edema is seen. The colon is largely decompressed. The terminal ileum and the appendix are unremarkable. Vascular/Lymphatic: No vascular abnormality is seen. No adenopathy is noted. A 9 mm para-aortic  node is present on image 69 series 2. Reproductive: The prostate is normal in size. Other: There is a small umbilical hernia containing only fat. Musculoskeletal: The lumbar vertebrae are in normal alignment with normal intervertebral disc spaces. The SI joints appear well corticated. IMPRESSION: 1. Inflammatory process in the region of the head of the pancreas and descending duodenum most consistent with acute pancreatitis with pancreatic exudate. A descending duodenal ulceration would be difficult to exclude. However pancreatitis is favored as the etiology. 2. The appendix and terminal ileum are unremarkable. 3. Small umbilical hernia containing only fat. Electronically Signed   By: Ivar Drape M.D.   On: 11/03/2018 11:04   US Abdomen Limited Ruq  Result Date: 11/03/2018 CLINICAL DATA:  Pancreatic inflammation/pancreatitis on CT performed earlier today. EXAM: ULTRASOUND ABDOMEN LIMITED RIGHT UPPER QUADRANT COMPARISON:  CT, 11/03/2018 FINDINGS: Gallbladder: Small dependent mobile gallstones. No gallbladder wall thickening or pericholecystic fluid. Common bile duct: Diameter: 5 mm Liver: Increased parenchymal echogenicity. No mass or focal lesion. Portal vein is patent on color Doppler imaging with normal direction of blood flow towards the liver. IMPRESSION: 1. Cholelithiasis without evidence of acute cholecystitis. No bile duct dilation. 2. Hepatic steatosis. Electronically Signed   By: Lajean Manes M.D.   On: 11/03/2018 15:27    Sherlyn Ebbert T. [facility] Pager [phone]  If 7PM-7AM, please contact night-coverage [email or web address] Password [email or web address] 11/04/2018, 10:04 AM

## 2018-11-05 DIAGNOSIS — K8000 Calculus of gallbladder with acute cholecystitis without obstruction: Secondary | ICD-10-CM

## 2018-11-05 DIAGNOSIS — K8020 Calculus of gallbladder without cholecystitis without obstruction: Secondary | ICD-10-CM | POA: Insufficient documentation

## 2018-11-05 DIAGNOSIS — E66813 Obesity, class 3: Secondary | ICD-10-CM

## 2018-11-05 DIAGNOSIS — R945 Abnormal results of liver function studies: Secondary | ICD-10-CM

## 2018-11-05 DIAGNOSIS — R7989 Other specified abnormal findings of blood chemistry: Secondary | ICD-10-CM

## 2018-11-05 DIAGNOSIS — K859 Acute pancreatitis without necrosis or infection, unspecified: Secondary | ICD-10-CM

## 2018-11-05 LAB — COMPREHENSIVE METABOLIC PANEL
ALT: 44 U/L (ref 0–44)
AST: 18 U/L (ref 15–41)
Albumin: 3.3 g/dL — ABNORMAL LOW (ref 3.5–5.0)
Alkaline Phosphatase: 56 U/L (ref 38–126)
Anion gap: 9 (ref 5–15)
BUN: 16 mg/dL (ref 6–20)
CO2: 20 mmol/L — AB (ref 22–32)
Calcium: 8 mg/dL — ABNORMAL LOW (ref 8.9–10.3)
Chloride: 106 mmol/L (ref 98–111)
Creatinine, Ser: 0.7 mg/dL (ref 0.61–1.24)
GFR calc Af Amer: >60 mL/min (ref >60)
GFR calc non Af Amer: >60 mL/min (ref >60)
GLUCOSE: 119 mg/dL — AB (ref 70–99)
Potassium: 3.7 mmol/L (ref 3.5–5.1)
SODIUM: 135 mmol/L (ref 135–145)
Total Bilirubin: 1.2 mg/dL (ref 0.3–1.2)
Total Protein: 6.1 g/dL — ABNORMAL LOW (ref 6.5–8.1)

## 2018-11-05 LAB — CBC
HCT: 44.3 % (ref 39.0–52.0)
Hemoglobin: 14.4 g/dL (ref 13.0–17.0)
MCH: 31.4 pg (ref 26.0–34.0)
MCHC: 32.5 g/dL (ref 30.0–36.0)
MCV: 96.5 fL (ref 80.0–100.0)
NRBC: 0 % (ref 0.0–0.2)
PLATELETS: 201 10*3/uL (ref 150–400)
RBC: 4.59 MIL/uL (ref 4.22–5.81)
RDW: 14 % (ref 11.5–15.5)
WBC: 18.7 10*3/uL — ABNORMAL HIGH (ref 4.0–10.5)

## 2018-11-05 LAB — LIPASE, BLOOD: Lipase: 186 U/L — ABNORMAL HIGH (ref 11–51)

## 2018-11-05 NOTE — Progress Notes (Signed)
PROGRESS NOTE  [name] HDQ:[id number] DOB: 1966-06-15 DOA: 11/03/2018 PCP: Patient, No Pcp Per  HPI/Recap of past 24 hours:  Ab pain is improving, no vomiting, able to drink water, but soup made his stomach hurt Wife at bedside  Assessment/Plan: Principal Problem:   Pancreatitis Active Problems:   Leg cramps   Osteoarthritis  Acute pancreatitis triglyceride 47 -report drinking vodaka 2-3 times a week + gallstone  Leukocytosis New on 1/11 No fever, repeat lab in am  Elevated lft normalized  Morbid obesity: Body mass index is 47.25 kg/m.    Code Status: full  Family Communication: patient and wife  Disposition Plan: not ready to discharge   Consultants:  none  Procedures:  none  Antibiotics:  none   Objective: BP 137/80 (BP Location: Left Arm)   Pulse (!) 122   Temp 99.3 F (37.4 C)   Resp 17   Ht 6\' 3"  (1.905 m)   Wt (!) 171.5 kg   SpO2 96%   BMI 47.25 kg/m   Intake/Output Summary (Last 24 hours) at 11/05/2018 1221 Last data filed at 11/05/2018 0542 Gross per 24 hour  Intake 1149.39 ml  Output -  Net 1149.39 ml   Filed Weights   11/03/18 0855 11/03/18 1430  Weight: (!) 171.5 kg (!) 171.5 kg    Exam: Patient is examined daily including today on 11/05/2018, exams remain the same as of yesterday except that has changed    General:  NAD  Cardiovascular: sinus tachcyardia  Respiratory: CTABL  Abdomen: protuberant, epigastric tenderness, mild guarding, no rebound,  positive BS  Musculoskeletal: No Edema  Neuro: alert, oriented   Data Reviewed: Basic Metabolic Panel: Recent Labs  Lab 11/03/18 0931 11/03/18 1532 11/04/18 0523 11/05/18 0423  NA 139  --  138 135  K 3.9  --  4.0 3.7  CL 110  --  107 106  CO2 23  --  25 20*  GLUCOSE 156*  --  143* 119*  BUN 23*  --  18 16  CREATININE 0.83 0.81 0.80 0.70  CALCIUM 8.7*  --  8.5* 8.0*  MG  --   --  2.1  --    Liver Function Tests: Recent Labs  Lab 11/03/18 0931  11/05/18 0423  AST 104* 18  ALT 85* 44  ALKPHOS 58 56  BILITOT 1.1 1.2  PROT 6.8 6.1*  ALBUMIN 4.0 3.3*   Recent Labs  Lab 11/03/18 0931 11/04/18 0523 11/05/18 0423  LIPASE 6,447* 590* 186*   No results for input(s): AMMONIA in the last 168 hours. CBC: Recent Labs  Lab 11/03/18 0931 11/03/18 1532 11/05/18 0423  WBC 7.5 10.2 18.7*  HGB 15.1 14.9 14.4  HCT 46.1 46.4 44.3  MCV 93.9 97.3 96.5  PLT 221 212 201   Cardiac Enzymes:   No results for input(s): CKTOTAL, CKMB, CKMBINDEX, TROPONINI in the last 168 hours. BNP (last 3 results) No results for input(s): BNP in the last 8760 hours.  ProBNP (last 3 results) No results for input(s): PROBNP in the last 8760 hours.  CBG: No results for input(s): GLUCAP in the last 168 hours.  No results found for this or any previous visit (from the past 240 hour(s)).   Studies: No results found.  Scheduled Meds: . enoxaparin (LOVENOX) injection  80 mg Subcutaneous Q24H    Continuous Infusions: . sodium chloride 75 mL/hr at 11/05/18 1146     Time spent: 37mins I have personally reviewed and interpreted on  11/05/2018 daily labs,  imagings as discussed above under date review session and assessment and plans.  I reviewed all nursing notes, pharmacy notes, vitals, pertinent old records  I have discussed plan of care as described above with RN , patient and family on 11/05/2018   Florencia Reasons MD, PhD  [facility] Pager [phone]. If 7PM-7AM, please contact night-coverage at [email or web address], password [email or web address] 11/05/2018, 12:21 PM  LOS: 2 days

## 2018-11-06 ENCOUNTER — Inpatient Hospital Stay (HOSPITAL_COMMUNITY): Payer: BLUE CROSS/BLUE SHIELD

## 2018-11-06 DIAGNOSIS — K851 Biliary acute pancreatitis without necrosis or infection: Principal | ICD-10-CM

## 2018-11-06 DIAGNOSIS — K8020 Calculus of gallbladder without cholecystitis without obstruction: Secondary | ICD-10-CM

## 2018-11-06 DIAGNOSIS — K859 Acute pancreatitis without necrosis or infection, unspecified: Secondary | ICD-10-CM

## 2018-11-06 LAB — CBC WITH DIFFERENTIAL/PLATELET
Abs Immature Granulocytes: 0.24 10*3/uL — ABNORMAL HIGH (ref 0.00–0.07)
BASOS ABS: 0 10*3/uL (ref 0.0–0.1)
Basophils Relative: 0 %
Eosinophils Absolute: 0.1 10*3/uL (ref 0.0–0.5)
Eosinophils Relative: 1 %
HEMATOCRIT: 41.6 % (ref 39.0–52.0)
Hemoglobin: 13.4 g/dL (ref 13.0–17.0)
Immature Granulocytes: 2 %
Lymphocytes Relative: 6 %
Lymphs Abs: 0.8 10*3/uL (ref 0.7–4.0)
MCH: 31.1 pg (ref 26.0–34.0)
MCHC: 32.2 g/dL (ref 30.0–36.0)
MCV: 96.5 fL (ref 80.0–100.0)
Monocytes Absolute: 1 10*3/uL (ref 0.1–1.0)
Monocytes Relative: 7 %
NRBC: 0 % (ref 0.0–0.2)
Neutro Abs: 12.4 10*3/uL — ABNORMAL HIGH (ref 1.7–7.7)
Neutrophils Relative %: 84 %
Platelets: 189 10*3/uL (ref 150–400)
RBC: 4.31 MIL/uL (ref 4.22–5.81)
RDW: 13.6 % (ref 11.5–15.5)
WBC: 14.6 10*3/uL — ABNORMAL HIGH (ref 4.0–10.5)

## 2018-11-06 LAB — COMPREHENSIVE METABOLIC PANEL
ALBUMIN: 3.2 g/dL — AB (ref 3.5–5.0)
ALT: 30 U/L (ref 0–44)
AST: 15 U/L (ref 15–41)
Alkaline Phosphatase: 56 U/L (ref 38–126)
Anion gap: 7 (ref 5–15)
BUN: 17 mg/dL (ref 6–20)
CO2: 23 mmol/L (ref 22–32)
Calcium: 8.1 mg/dL — ABNORMAL LOW (ref 8.9–10.3)
Chloride: 105 mmol/L (ref 98–111)
Creatinine, Ser: 0.7 mg/dL (ref 0.61–1.24)
GFR calc Af Amer: >60 mL/min (ref >60)
Glucose, Bld: 125 mg/dL — ABNORMAL HIGH (ref 70–99)
Potassium: 3.4 mmol/L — ABNORMAL LOW (ref 3.5–5.1)
Sodium: 135 mmol/L (ref 135–145)
Total Bilirubin: 1.5 mg/dL — ABNORMAL HIGH (ref 0.3–1.2)
Total Protein: 6 g/dL — ABNORMAL LOW (ref 6.5–8.1)

## 2018-11-06 LAB — TSH: TSH: 1.512 u[IU]/mL (ref 0.350–4.500)

## 2018-11-06 LAB — MAGNESIUM: Magnesium: 2.2 mg/dL (ref 1.7–2.4)

## 2018-11-06 LAB — LIPASE, BLOOD: Lipase: 73 U/L — ABNORMAL HIGH (ref 11–51)

## 2018-11-06 MED ORDER — ACETAMINOPHEN 500 MG PO TABS: 1000.0000 mg | ORAL_TABLET | ORAL | Status: AC

## 2018-11-06 MED ORDER — CELECOXIB 200 MG PO CAPS: 200.0000 mg | ORAL_CAPSULE | ORAL | Status: AC

## 2018-11-06 MED ORDER — DEXTROSE 5 % IV SOLN
3.0000 g | INTRAVENOUS | Status: DC
  Filled 2018-11-06: qty 3000

## 2018-11-06 MED ORDER — GABAPENTIN 300 MG PO CAPS: 300.0000 mg | ORAL_CAPSULE | ORAL | Status: AC

## 2018-11-06 MED ORDER — CHLORHEXIDINE GLUCONATE CLOTH 2 % EX PADS: 6.0000 | MEDICATED_PAD | Freq: Once | CUTANEOUS | Status: DC

## 2018-11-06 MED ORDER — SENNOSIDES-DOCUSATE SODIUM 8.6-50 MG PO TABS
1.0000 | ORAL_TABLET | Freq: Two times a day (BID) | ORAL | Status: DC
  Administered 2018-11-07 – 2018-11-08 (×3): 1 via ORAL
  Filled 2018-11-06 (×4): qty 1

## 2018-11-06 MED ORDER — METRONIDAZOLE IN NACL 5-0.79 MG/ML-% IV SOLN: 500.0000 mg | INTRAVENOUS | Status: DC

## 2018-11-06 MED ORDER — PANTOPRAZOLE SODIUM 40 MG PO TBEC
40.0000 mg | DELAYED_RELEASE_TABLET | Freq: Two times a day (BID) | ORAL | Status: DC
  Administered 2018-11-06 – 2018-11-08 (×5): 40 mg via ORAL
  Filled 2018-11-06 (×5): qty 1

## 2018-11-06 MED ORDER — CHLORHEXIDINE GLUCONATE CLOTH 2 % EX PADS
6.0000 | MEDICATED_PAD | Freq: Once | CUTANEOUS | Status: AC
  Administered 2018-11-07: 6 via TOPICAL

## 2018-11-06 MED ORDER — POTASSIUM CHLORIDE CRYS ER 20 MEQ PO TBCR
40.0000 meq | EXTENDED_RELEASE_TABLET | Freq: Once | ORAL | Status: AC
  Administered 2018-11-06: 40 meq via ORAL
  Filled 2018-11-06: qty 2

## 2018-11-06 MED ORDER — CHLORPROMAZINE HCL 25 MG PO TABS
25.0000 mg | ORAL_TABLET | Freq: Four times a day (QID) | ORAL | Status: DC | PRN
  Administered 2018-11-07: 25 mg via ORAL
  Filled 2018-11-06 (×2): qty 1

## 2018-11-06 MED ORDER — POLYETHYLENE GLYCOL 3350 17 G PO PACK
17.0000 g | PACK | Freq: Every day | ORAL | Status: DC
  Administered 2018-11-06: 17 g via ORAL
  Filled 2018-11-06 (×3): qty 1

## 2018-11-06 NOTE — H&P (View-Only) (Signed)
[name]  1966/06/06 [id number]  CARE TEAM:  PCP: Patient, No Pcp Per  Outpatient Care Team: Patient Care Team: Patient, No Pcp Per as PCP - General (General Practice)  Inpatient Treatment Team: Treatment Team: Attending Provider: Florencia Reasons, MD; Registered Nurse: Vella Redhead, RN; Registered Nurse: Yvette Rack, RN; Rounding Team: Fatima Blank, MD; Registered Nurse: Alphonzo Cruise, RN; Consulting Physician: Edison Pace, Md, MD   This patient is a 53 y.o.male who presents today for surgical evaluation at the request of Dr Erlinda Hong.   Chief complaint / Reason for evaluation: Acute pancreatitis.  Question gallstone etiology  Pleasant gentleman relocated from [place] with his family.  Wife and daughter at bedside.  And her about 5 months.  Had episode of severe upper abdominal pain.  It had trips camping the night before.  Not a radical change in diet.  He is morbidly obese and has been intentionally trying to lose some weight.  Patient's pain was still very intense and concerning.  No relief with Tums.  He does have a history of reflux disease.  He drinks a few black Russians in a month but no recent alcohol ingestion.  Never had anything like this before.  No personal nor family history of GI/colon cancer, inflammatory bowel disease, irritable bowel syndrome, allergy such as Celiac Sprue, dietary/dairy problems, colitis, ulcers nor gastritis.  No recent sick contacts/gastroenteritis.  No travel outside the country.  No changes in diet.  No dysphagia to solids or liquids.  No hematochezia, hematemesis, coffee ground emesis.  No evidence of prior gastric/peptic ulceration.  Have some heartburn reflux but is usually controlled with Tums.  He can walk at least an hour or 2 before he has to stop.  No cardiopulmonary issues.  He does not smoke.  He is not a diabetic.  His admission, his pain has gone down but not gone away.  Ultrasound confirmed gallstones.  Surgical consultation  requested    Assessment  [name]  53 y.o. male       Problem List:  Principal Problem:   Gallstone pancreatitis Active Problems:   Leg cramps   Osteoarthritis   Symptomatic cholelithiasis   Obesity, Class III, BMI 40-49.9 (morbid obesity) (HCC)   LFT elevation   Assessment  Acute pancreatitis most likely of biliary etiology  [facility] Stay = 3 days)  Plan:  -Laparoscopic cholecystectomy and cholangiogram this admission.  His lipase numbers are nearly normalizing.  Most likely can attempt tomorrow.  He still has some abdominal discomfort which gives me a little bit of pause but he is not hemodynamically deteriorating, so hopefully the risk of a pseudocyst formation or more significant issues is rather low.  The anatomy & physiology of hepatobiliary & pancreatic function was discussed.  The pathophysiology of gallbladder dysfunction was discussed.  Natural history risks without surgery was discussed.   I feel the risks of no intervention will lead to serious problems that outweigh the operative risks; therefore, I recommended cholecystectomy to remove the pathology.  I explained laparoscopic techniques with possible need for an open approach.  Probable cholangiogram to evaluate the bilary tract was explained as well.    Risks such as bleeding, infection, abscess, leak, injury to other organs, need for repair of tissues / organs, need for further treatment, stroke, heart attack, death, and other risks were discussed.  I noted a good likelihood this will help address the problem.  Possibility that this will not correct all abdominal symptoms was explained.  Goals of post-operative recovery were discussed as well.  We will work to minimize complications.  An educational handout further explaining the pathology and treatment options was given as well.  Questions were answered.  The patient & family expresses understanding & wishes to proceed with surgery.    If cholangiogram  suspicious for's persistent choledocholithiasis, gastrology consultation for possible ERCP.  I would hold off for now since statistically likelihood no persistent common bile duct stones.  -Another possibility a contributor is alcohol ingestion but does not sound like it is been too significant. -VTE prophylaxis- SCDs, etc -mobilize as tolerated to help recovery  35 minutes spent in review, evaluation, examination, counseling, and coordination of care.  More than 50% of that time was spent in counseling.  Adin Hector, MD, FACS, MASCRS Gastrointestinal and Minimally Invasive Surgery    [address], [place], [place] [address] [phone] Main / Paging [phone] Fax   11/06/2018      History reviewed. No pertinent past medical history.  Past Surgical History:  Procedure Laterality Date  . ROTATOR CUFF REPAIR Left 1990    Social History   Socioeconomic History  . Marital status: Married    Spouse name: Not on file  . Number of children: Not on file  . Years of education: Not on file  . Highest education level: Not on file  Occupational History  . Not on file  Social Needs  . Financial resource strain: Not on file  . Food insecurity:    Worry: Not on file    Inability: Not on file  . Transportation needs:    Medical: Not on file    Non-medical: Not on file  Tobacco Use  . Smoking status: Former Research scientist (life sciences)  . Smokeless tobacco: Never Used  Substance and Sexual Activity  . Alcohol use: Not Currently  . Drug use: Not Currently  . Sexual activity: Not on file  Lifestyle  . Physical activity:    Days per week: Not on file    Minutes per session: Not on file  . Stress: Not on file  Relationships  . Social connections:    Talks on phone: Not on file    Gets together: Not on file    Attends religious service: Not on file    Active member of club or organization: Not on file    Attends meetings of clubs or organizations: Not on file     Relationship status: Not on file  . Intimate partner violence:    Fear of current or ex partner: Not on file    Emotionally abused: Not on file    Physically abused: Not on file    Forced sexual activity: Not on file  Other Topics Concern  . Not on file  Social History Narrative  . Not on file    History reviewed. No pertinent family history.  Current Facility-Administered Medications  Medication Dose Route Frequency Provider Last Rate Last Dose  . 0.9 %  sodium chloride infusion   Intravenous Continuous Florencia Reasons, MD 75 mL/hr at 11/06/18 0600    . acetaminophen (TYLENOL) tablet 650 mg  650 mg Oral Q6H PRN Spongberg, Audie Pinto, MD       Or  . acetaminophen (TYLENOL) suppository 650 mg  650 mg Rectal Q6H PRN Marcell Anger, MD      . enoxaparin (LOVENOX) injection 80 mg  80 mg Subcutaneous Q24H Marcell Anger, MD   80 mg at 11/05/18 1835  .  HYDROmorphone (DILAUDID) injection 1 mg  1 mg Intravenous Q2H PRN Marcell Anger, MD   1 mg at 11/06/18 1206  . ibuprofen (ADVIL,MOTRIN) tablet 400 mg  400 mg Oral Q6H PRN Marcell Anger, MD   400 mg at 11/05/18 1631  . ondansetron (ZOFRAN) tablet 4 mg  4 mg Oral Q6H PRN Marcell Anger, MD       Or  . ondansetron [facility]) injection 4 mg  4 mg Intravenous Q6H PRN Marcell Anger, MD   4 mg at 11/04/18 0811  . senna-docusate (Senokot-S) tablet 1 tablet  1 tablet Oral QHS PRN Spongberg, Audie Pinto, MD      . traMADol Veatrice Bourbon) tablet 50 mg  50 mg Oral Q6H PRN Marcell Anger, MD   50 mg at 11/04/18 0845     No Known Allergies  ROS:   All other systems reviewed & are negative except per HPI or as noted below: Constitutional:  No fevers, chills, sweats.  Weight stable Eyes:  No vision changes, No discharge HENT:  No sore throats, nasal drainage Lymph: No neck swelling, No bruising easily Pulmonary:  No cough, productive sputum CV: No orthopnea, PND  Patient walks 60 minutes  for about 2 miles without difficulty.  No exertional chest/neck/shoulder/arm pain. GI: No personal nor family history of GI/colon cancer, inflammatory bowel disease, irritable bowel syndrome, allergy such as Celiac Sprue, dietary/dairy problems, colitis, ulcers nor gastritis.  No recent sick contacts/gastroenteritis.  No travel outside the country.  No changes in diet. Renal: No UTIs, No hematuria Genital:  No drainage, bleeding, masses Musculoskeletal: No severe joint pain.  Good ROM major joints Skin:  No sores or lesions.  No rashes Heme/Lymph:  No easy bleeding.  No swollen lymph nodes Neuro: No focal weakness/numbness.  No seizures Psych: No suicidal ideation.  No hallucinations  BP 129/75   Pulse (!) 103   Temp 98.2 F (36.8 C) (Oral)   Resp 18   Ht 6\' 3"  (1.905 m)   Wt (!) 171.5 kg   SpO2 94%   BMI 47.25 kg/m   Physical Exam: General: Pt awake/alert/oriented x4 in mild major acute distress.  Tired but not toxic Eyes: PERRL, normal EOM. Sclera nonicteric Neuro: CN II-XII intact w/o focal sensory/motor deficits. Lymph: No head/neck/groin lymphadenopathy Psych:  No delerium/psychosis/paranoia HENT: Normocephalic, Mucus membranes moist.  No thrush Neck: Supple, No tracheal deviation Chest: No pain.  Good respiratory excursion. CV:  Pulses intact.  Regular rhythm Abdomen: Morbidly obese but soft.  Epigastric and right upper quadrant discomfort with Murphy sign.  Rest of the abdomen nontender.  Not particularly distended.  No prior incisions.  No umbilical hernia.   Gen:  No inguinal hernias.  No inguinal lymphadenopathy.   Ext:  SCDs BLE.  No significant edema.  No cyanosis Skin: No petechiae / purpurea.  No major sores Musculoskeletal: No severe joint pain.  Good ROM major joints   Results:   Labs: Results for orders placed or performed during the hospital encounter of 11/03/18 (from the past 48 hour(s))  CBC     Status: Abnormal   Collection Time: 11/05/18  4:23 AM   Result Value Ref Range   WBC 18.7 (H) 4.0 - 10.5 K/uL   RBC 4.59 4.22 - 5.81 MIL/uL   Hemoglobin 14.4 13.0 - 17.0 g/dL   HCT 44.3 39.0 - 52.0 %   MCV 96.5 80.0 - 100.0 fL   MCH 31.4 26.0 - 34.0 pg   MCHC 32.5  30.0 - 36.0 g/dL   RDW 14.0 11.5 - 15.5 %   Platelets 201 150 - 400 K/uL   nRBC 0.0 0.0 - 0.2 %    Comment: Performed at [facility], [place] [address] [place], [place] [address]  Comprehensive metabolic panel     Status: Abnormal   Collection Time: 11/05/18  4:23 AM  Result Value Ref Range   Sodium 135 135 - 145 mmol/L   Potassium 3.7 3.5 - 5.1 mmol/L   Chloride 106 98 - 111 mmol/L   CO2 20 (L) 22 - 32 mmol/L   Glucose, Bld 119 (H) 70 - 99 mg/dL   BUN 16 6 - 20 mg/dL   Creatinine, Ser 0.70 0.61 - 1.24 mg/dL   Calcium 8.0 (L) 8.9 - 10.3 mg/dL   Total Protein 6.1 (L) 6.5 - 8.1 g/dL   Albumin 3.3 (L) 3.5 - 5.0 g/dL   AST 18 15 - 41 U/L   ALT 44 0 - 44 U/L   Alkaline Phosphatase 56 38 - 126 U/L   Total Bilirubin 1.2 0.3 - 1.2 mg/dL   GFR calc non Af Amer >60 >60 mL/min   GFR calc Af Amer >60 >60 mL/min   Anion gap 9 5 - 15    Comment: Performed at [facility], [place] [address], [place], [place] [address]  Lipase, blood     Status: Abnormal   Collection Time: 11/05/18  4:23 AM  Result Value Ref Range   Lipase 186 (H) 11 - 51 U/L    Comment: Performed at [facility], [place] [address], [place], [place] [address]  CBC with Differential/Platelet     Status: Abnormal   Collection Time: 11/06/18  3:16 AM  Result Value Ref Range   WBC 14.6 (H) 4.0 - 10.5 K/uL   RBC 4.31 4.22 - 5.81 MIL/uL   Hemoglobin 13.4 13.0 - 17.0 g/dL   HCT 41.6 39.0 - 52.0 %   MCV 96.5 80.0 - 100.0 fL   MCH 31.1 26.0 - 34.0 pg   MCHC 32.2 30.0 - 36.0 g/dL   RDW 13.6 11.5 - 15.5 %   Platelets 189 150 - 400 K/uL   nRBC 0.0 0.0 - 0.2 %   Neutrophils Relative % 84 %   Neutro Abs 12.4 (H) 1.7 - 7.7 K/uL   Lymphocytes Relative 6 %   Lymphs Abs 0.8  0.7 - 4.0 K/uL   Monocytes Relative 7 %   Monocytes Absolute 1.0 0.1 - 1.0 K/uL   Eosinophils Relative 1 %   Eosinophils Absolute 0.1 0.0 - 0.5 K/uL   Basophils Relative 0 %   Basophils Absolute 0.0 0.0 - 0.1 K/uL   Immature Granulocytes 2 %   Abs Immature Granulocytes 0.24 (H) 0.00 - 0.07 K/uL    Comment: Performed at [facility], [place] [address] [place], [place] [address]  Comprehensive metabolic panel     Status: Abnormal   Collection Time: 11/06/18  3:16 AM  Result Value Ref Range   Sodium 135 135 - 145 mmol/L   Potassium 3.4 (L) 3.5 - 5.1 mmol/L   Chloride 105 98 - 111 mmol/L   CO2 23 22 - 32 mmol/L   Glucose, Bld 125 (H) 70 - 99 mg/dL   BUN 17 6 - 20 mg/dL   Creatinine, Ser 0.70 0.61 - 1.24 mg/dL   Calcium 8.1 (L) 8.9 - 10.3 mg/dL   Total Protein 6.0 (L) 6.5 - 8.1 g/dL  Albumin 3.2 (L) 3.5 - 5.0 g/dL   AST 15 15 - 41 U/L   ALT 30 0 - 44 U/L   Alkaline Phosphatase 56 38 - 126 U/L   Total Bilirubin 1.5 (H) 0.3 - 1.2 mg/dL   GFR calc non Af Amer >60 >60 mL/min   GFR calc Af Amer >60 >60 mL/min   Anion gap 7 5 - 15    Comment: Performed at [facility], [place] [address] [place], [place] [address]  Magnesium     Status: None   Collection Time: 11/06/18  3:16 AM  Result Value Ref Range   Magnesium 2.2 1.7 - 2.4 mg/dL    Comment: Performed at [facility], [place] [address], [place], [place] [address]  Lipase, blood     Status: Abnormal   Collection Time: 11/06/18  3:16 AM  Result Value Ref Range   Lipase 73 (H) 11 - 51 U/L    Comment: Performed at [facility], [place] [address], [place], [place] [address]  TSH     Status: None   Collection Time: 11/06/18  3:16 AM  Result Value Ref Range   TSH 1.512 0.350 - 4.500 uIU/mL    Comment: Performed by a 3rd Generation assay with a functional sensitivity of <=0.01 uIU/mL. Performed at [facility], [place] [address], [place], [place]  [address]     Imaging / Studies: Dg Chest 1 View  Result Date: 11/03/2018 CLINICAL DATA:  Epigastric abdominal pain. EXAM: CHEST  1 VIEW COMPARISON:  None. FINDINGS: The heart size and mediastinal contours are within normal limits. Both lungs are clear. No pneumothorax or pleural effusion is noted. The visualized skeletal structures are unremarkable. IMPRESSION: No active disease. Electronically Signed   By: Marijo Conception, M.D.   On: 11/03/2018 10:26   Ct Abdomen Pelvis W Contrast  Result Date: 11/03/2018 CLINICAL DATA:  Abdominal pain and distention today EXAM: CT ABDOMEN AND PELVIS WITH CONTRAST TECHNIQUE: Multidetector CT imaging of the abdomen and pelvis was performed using the standard protocol following bolus administration of intravenous contrast. CONTRAST:  159mL ISOVUE-300 IOPAMIDOL (ISOVUE-300) INJECTION 61% COMPARISON:  None. FINDINGS: Lower chest: The lung bases are clear. The heart is within normal limits in size. No pericardial effusion is seen. There is a moderate amount of epicardial fat present. Hepatobiliary: The liver enhances with no focal abnormality. No ductal dilatation is seen. The gallbladder is well seen with no calcified gallstones noted. Pancreas: There is strandiness around the descending duodenum and head of the pancreas with some fluid layering in the anterior pararenal space extending caudally. These findings are most consistent with pancreatitis although and adjacent descending duodenal duct ulceration can not be excluded. There is some prominence of the common bile duct most likely due to the adjacent inflammatory process. The pancreatic duct is not dilated. Some fluid also is present layering in the anterior pararenal space on the left. No definite mass is seen. Fluid also extends into the extraperitoneal spaces anteriorly. Spleen: The spleen is unremarkable. Adrenals/Urinary Tract: The adrenal glands appear normal. The kidneys enhance with no calculus or mass and on delayed  images, no hydronephrosis is seen. The ureters appear normal in caliber. The urinary bladder is moderately distended with no abnormality noted. Stomach/Bowel: The stomach is largely decompressed with no significant abnormality other than the inflammation surrounding the descending duodenum possibly related to ulceration or adjacent pancreatitis. No small bowel distention or edema is seen. The colon is  largely decompressed. The terminal ileum and the appendix are unremarkable. Vascular/Lymphatic: No vascular abnormality is seen. No adenopathy is noted. A 9 mm para-aortic node is present on image 69 series 2. Reproductive: The prostate is normal in size. Other: There is a small umbilical hernia containing only fat. Musculoskeletal: The lumbar vertebrae are in normal alignment with normal intervertebral disc spaces. The SI joints appear well corticated. IMPRESSION: 1. Inflammatory process in the region of the head of the pancreas and descending duodenum most consistent with acute pancreatitis with pancreatic exudate. A descending duodenal ulceration would be difficult to exclude. However pancreatitis is favored as the etiology. 2. The appendix and terminal ileum are unremarkable. 3. Small umbilical hernia containing only fat. Electronically Signed   By: Ivar Drape M.D.   On: 11/03/2018 11:04   US Abdomen Limited Ruq  Result Date: 11/03/2018 CLINICAL DATA:  Pancreatic inflammation/pancreatitis on CT performed earlier today. EXAM: ULTRASOUND ABDOMEN LIMITED RIGHT UPPER QUADRANT COMPARISON:  CT, 11/03/2018 FINDINGS: Gallbladder: Small dependent mobile gallstones. No gallbladder wall thickening or pericholecystic fluid. Common bile duct: Diameter: 5 mm Liver: Increased parenchymal echogenicity. No mass or focal lesion. Portal vein is patent on color Doppler imaging with normal direction of blood flow towards the liver. IMPRESSION: 1. Cholelithiasis without evidence of acute cholecystitis. No bile duct dilation. 2.  Hepatic steatosis. Electronically Signed   By: Lajean Manes M.D.   On: 11/03/2018 15:27    Medications / Allergies: per chart  Antibiotics: Anti-infectives (From admission, onward)   None        Note: Portions of this report may have been transcribed using voice recognition software. Every effort was made to ensure accuracy; however, inadvertent computerized transcription errors may be present.   Any transcriptional errors that result from this process are unintentional.    Adin Hector, MD, FACS, MASCRS Gastrointestinal and Minimally Invasive Surgery    [address] [place], [place] [address] [phone] Main / Paging [phone] Fax   11/06/2018

## 2018-11-06 NOTE — Plan of Care (Signed)
  Problem: Health Behavior/Discharge Planning: Goal: Ability to manage health-related needs will improve Outcome: Progressing   

## 2018-11-06 NOTE — Consult Note (Signed)
[name]  05/01/1966 [id number]  CARE TEAM:  PCP: Patient, No Pcp Per  Outpatient Care Team: Patient Care Team: Patient, No Pcp Per as PCP - General (General Practice)  Inpatient Treatment Team: Treatment Team: Attending Provider: Florencia Reasons, MD; Registered Nurse: Vella Redhead, RN; Registered Nurse: Yvette Rack, RN; Rounding Team: Fatima Blank, MD; Registered Nurse: Alphonzo Cruise, RN; Consulting Physician: Edison Pace, Md, MD   This patient is a 53 y.o.male who presents today for surgical evaluation at the request of Dr Erlinda Hong.   Chief complaint / Reason for evaluation: Acute pancreatitis.  Question gallstone etiology  Pleasant gentleman relocated from [place] with his family.  Wife and daughter at bedside.  And her about 5 months.  Had episode of severe upper abdominal pain.  It had trips camping the night before.  Not a radical change in diet.  He is morbidly obese and has been intentionally trying to lose some weight.  Patient's pain was still very intense and concerning.  No relief with Tums.  He does have a history of reflux disease.  He drinks a few black Russians in a month but no recent alcohol ingestion.  Never had anything like this before.  No personal nor family history of GI/colon cancer, inflammatory bowel disease, irritable bowel syndrome, allergy such as Celiac Sprue, dietary/dairy problems, colitis, ulcers nor gastritis.  No recent sick contacts/gastroenteritis.  No travel outside the country.  No changes in diet.  No dysphagia to solids or liquids.  No hematochezia, hematemesis, coffee ground emesis.  No evidence of prior gastric/peptic ulceration.  Have some heartburn reflux but is usually controlled with Tums.  He can walk at least an hour or 2 before he has to stop.  No cardiopulmonary issues.  He does not smoke.  He is not a diabetic.  His admission, his pain has gone down but not gone away.  Ultrasound confirmed gallstones.  Surgical consultation  requested    Assessment  [name]  53 y.o. male       Problem List:  Principal Problem:   Gallstone pancreatitis Active Problems:   Leg cramps   Osteoarthritis   Symptomatic cholelithiasis   Obesity, Class III, BMI 40-49.9 (morbid obesity) (HCC)   LFT elevation   Assessment  Acute pancreatitis most likely of biliary etiology  [facility] Stay = 3 days)  Plan:  -Laparoscopic cholecystectomy and cholangiogram this admission.  His lipase numbers are nearly normalizing.  Most likely can attempt tomorrow.  He still has some abdominal discomfort which gives me a little bit of pause but he is not hemodynamically deteriorating, so hopefully the risk of a pseudocyst formation or more significant issues is rather low.  The anatomy & physiology of hepatobiliary & pancreatic function was discussed.  The pathophysiology of gallbladder dysfunction was discussed.  Natural history risks without surgery was discussed.   I feel the risks of no intervention will lead to serious problems that outweigh the operative risks; therefore, I recommended cholecystectomy to remove the pathology.  I explained laparoscopic techniques with possible need for an open approach.  Probable cholangiogram to evaluate the bilary tract was explained as well.    Risks such as bleeding, infection, abscess, leak, injury to other organs, need for repair of tissues / organs, need for further treatment, stroke, heart attack, death, and other risks were discussed.  I noted a good likelihood this will help address the problem.  Possibility that this will not correct all abdominal symptoms was explained.  Goals of post-operative recovery were discussed as well.  We will work to minimize complications.  An educational handout further explaining the pathology and treatment options was given as well.  Questions were answered.  The patient & family expresses understanding & wishes to proceed with surgery.    If cholangiogram  suspicious for's persistent choledocholithiasis, gastrology consultation for possible ERCP.  I would hold off for now since statistically likelihood no persistent common bile duct stones.  -Another possibility a contributor is alcohol ingestion but does not sound like it is been too significant. -VTE prophylaxis- SCDs, etc -mobilize as tolerated to help recovery  35 minutes spent in review, evaluation, examination, counseling, and coordination of care.  More than 50% of that time was spent in counseling.  Adin Hector, MD, FACS, MASCRS Gastrointestinal and Minimally Invasive Surgery    [address], [place], [place] [address] [phone] Main / Paging [phone] Fax   11/06/2018      History reviewed. No pertinent past medical history.  Past Surgical History:  Procedure Laterality Date  . ROTATOR CUFF REPAIR Left 1990    Social History   Socioeconomic History  . Marital status: Married    Spouse name: Not on file  . Number of children: Not on file  . Years of education: Not on file  . Highest education level: Not on file  Occupational History  . Not on file  Social Needs  . Financial resource strain: Not on file  . Food insecurity:    Worry: Not on file    Inability: Not on file  . Transportation needs:    Medical: Not on file    Non-medical: Not on file  Tobacco Use  . Smoking status: Former Research scientist (life sciences)  . Smokeless tobacco: Never Used  Substance and Sexual Activity  . Alcohol use: Not Currently  . Drug use: Not Currently  . Sexual activity: Not on file  Lifestyle  . Physical activity:    Days per week: Not on file    Minutes per session: Not on file  . Stress: Not on file  Relationships  . Social connections:    Talks on phone: Not on file    Gets together: Not on file    Attends religious service: Not on file    Active member of club or organization: Not on file    Attends meetings of clubs or organizations: Not on file     Relationship status: Not on file  . Intimate partner violence:    Fear of current or ex partner: Not on file    Emotionally abused: Not on file    Physically abused: Not on file    Forced sexual activity: Not on file  Other Topics Concern  . Not on file  Social History Narrative  . Not on file    History reviewed. No pertinent family history.  Current Facility-Administered Medications  Medication Dose Route Frequency Provider Last Rate Last Dose  . 0.9 %  sodium chloride infusion   Intravenous Continuous Florencia Reasons, MD 75 mL/hr at 11/06/18 0600    . acetaminophen (TYLENOL) tablet 650 mg  650 mg Oral Q6H PRN Spongberg, Audie Pinto, MD       Or  . acetaminophen (TYLENOL) suppository 650 mg  650 mg Rectal Q6H PRN Marcell Anger, MD      . enoxaparin (LOVENOX) injection 80 mg  80 mg Subcutaneous Q24H Marcell Anger, MD   80 mg at 11/05/18 1835  .  HYDROmorphone (DILAUDID) injection 1 mg  1 mg Intravenous Q2H PRN Marcell Anger, MD   1 mg at 11/06/18 1206  . ibuprofen (ADVIL,MOTRIN) tablet 400 mg  400 mg Oral Q6H PRN Marcell Anger, MD   400 mg at 11/05/18 1631  . ondansetron (ZOFRAN) tablet 4 mg  4 mg Oral Q6H PRN Marcell Anger, MD       Or  . ondansetron [facility]) injection 4 mg  4 mg Intravenous Q6H PRN Marcell Anger, MD   4 mg at 11/04/18 0811  . senna-docusate (Senokot-S) tablet 1 tablet  1 tablet Oral QHS PRN Spongberg, Audie Pinto, MD      . traMADol Veatrice Bourbon) tablet 50 mg  50 mg Oral Q6H PRN Marcell Anger, MD   50 mg at 11/04/18 0845     No Known Allergies  ROS:   All other systems reviewed & are negative except per HPI or as noted below: Constitutional:  No fevers, chills, sweats.  Weight stable Eyes:  No vision changes, No discharge HENT:  No sore throats, nasal drainage Lymph: No neck swelling, No bruising easily Pulmonary:  No cough, productive sputum CV: No orthopnea, PND  Patient walks 60 minutes  for about 2 miles without difficulty.  No exertional chest/neck/shoulder/arm pain. GI: No personal nor family history of GI/colon cancer, inflammatory bowel disease, irritable bowel syndrome, allergy such as Celiac Sprue, dietary/dairy problems, colitis, ulcers nor gastritis.  No recent sick contacts/gastroenteritis.  No travel outside the country.  No changes in diet. Renal: No UTIs, No hematuria Genital:  No drainage, bleeding, masses Musculoskeletal: No severe joint pain.  Good ROM major joints Skin:  No sores or lesions.  No rashes Heme/Lymph:  No easy bleeding.  No swollen lymph nodes Neuro: No focal weakness/numbness.  No seizures Psych: No suicidal ideation.  No hallucinations  BP 129/75   Pulse (!) 103   Temp 98.2 F (36.8 C) (Oral)   Resp 18   Ht 6\' 3"  (1.905 m)   Wt (!) 171.5 kg   SpO2 94%   BMI 47.25 kg/m   Physical Exam: General: Pt awake/alert/oriented x4 in mild major acute distress.  Tired but not toxic Eyes: PERRL, normal EOM. Sclera nonicteric Neuro: CN II-XII intact w/o focal sensory/motor deficits. Lymph: No head/neck/groin lymphadenopathy Psych:  No delerium/psychosis/paranoia HENT: Normocephalic, Mucus membranes moist.  No thrush Neck: Supple, No tracheal deviation Chest: No pain.  Good respiratory excursion. CV:  Pulses intact.  Regular rhythm Abdomen: Morbidly obese but soft.  Epigastric and right upper quadrant discomfort with Murphy sign.  Rest of the abdomen nontender.  Not particularly distended.  No prior incisions.  No umbilical hernia.   Gen:  No inguinal hernias.  No inguinal lymphadenopathy.   Ext:  SCDs BLE.  No significant edema.  No cyanosis Skin: No petechiae / purpurea.  No major sores Musculoskeletal: No severe joint pain.  Good ROM major joints   Results:   Labs: Results for orders placed or performed during the hospital encounter of 11/03/18 (from the past 48 hour(s))  CBC     Status: Abnormal   Collection Time: 11/05/18  4:23 AM   Result Value Ref Range   WBC 18.7 (H) 4.0 - 10.5 K/uL   RBC 4.59 4.22 - 5.81 MIL/uL   Hemoglobin 14.4 13.0 - 17.0 g/dL   HCT 44.3 39.0 - 52.0 %   MCV 96.5 80.0 - 100.0 fL   MCH 31.4 26.0 - 34.0 pg   MCHC 32.5  30.0 - 36.0 g/dL   RDW 14.0 11.5 - 15.5 %   Platelets 201 150 - 400 K/uL   nRBC 0.0 0.0 - 0.2 %    Comment: Performed at [facility], [place] [address] [place], [place] [address]  Comprehensive metabolic panel     Status: Abnormal   Collection Time: 11/05/18  4:23 AM  Result Value Ref Range   Sodium 135 135 - 145 mmol/L   Potassium 3.7 3.5 - 5.1 mmol/L   Chloride 106 98 - 111 mmol/L   CO2 20 (L) 22 - 32 mmol/L   Glucose, Bld 119 (H) 70 - 99 mg/dL   BUN 16 6 - 20 mg/dL   Creatinine, Ser 0.70 0.61 - 1.24 mg/dL   Calcium 8.0 (L) 8.9 - 10.3 mg/dL   Total Protein 6.1 (L) 6.5 - 8.1 g/dL   Albumin 3.3 (L) 3.5 - 5.0 g/dL   AST 18 15 - 41 U/L   ALT 44 0 - 44 U/L   Alkaline Phosphatase 56 38 - 126 U/L   Total Bilirubin 1.2 0.3 - 1.2 mg/dL   GFR calc non Af Amer >60 >60 mL/min   GFR calc Af Amer >60 >60 mL/min   Anion gap 9 5 - 15    Comment: Performed at [facility], [place] [address] [place], [place] [address]  Lipase, blood     Status: Abnormal   Collection Time: 11/05/18  4:23 AM  Result Value Ref Range   Lipase 186 (H) 11 - 51 U/L    Comment: Performed at [facility], [place] [address], [place], [place] [address]  CBC with Differential/Platelet     Status: Abnormal   Collection Time: 11/06/18  3:16 AM  Result Value Ref Range   WBC 14.6 (H) 4.0 - 10.5 K/uL   RBC 4.31 4.22 - 5.81 MIL/uL   Hemoglobin 13.4 13.0 - 17.0 g/dL   HCT 41.6 39.0 - 52.0 %   MCV 96.5 80.0 - 100.0 fL   MCH 31.1 26.0 - 34.0 pg   MCHC 32.2 30.0 - 36.0 g/dL   RDW 13.6 11.5 - 15.5 %   Platelets 189 150 - 400 K/uL   nRBC 0.0 0.0 - 0.2 %   Neutrophils Relative % 84 %   Neutro Abs 12.4 (H) 1.7 - 7.7 K/uL   Lymphocytes Relative 6 %   Lymphs Abs 0.8  0.7 - 4.0 K/uL   Monocytes Relative 7 %   Monocytes Absolute 1.0 0.1 - 1.0 K/uL   Eosinophils Relative 1 %   Eosinophils Absolute 0.1 0.0 - 0.5 K/uL   Basophils Relative 0 %   Basophils Absolute 0.0 0.0 - 0.1 K/uL   Immature Granulocytes 2 %   Abs Immature Granulocytes 0.24 (H) 0.00 - 0.07 K/uL    Comment: Performed at [facility], [place] [address] [place],  [address]  Comprehensive metabolic panel     Status: Abnormal   Collection Time: 11/06/18  3:16 AM  Result Value Ref Range   Sodium 135 135 - 145 mmol/L   Potassium 3.4 (L) 3.5 - 5.1 mmol/L   Chloride 105 98 - 111 mmol/L   CO2 23 22 - 32 mmol/L   Glucose, Bld 125 (H) 70 - 99 mg/dL   BUN 17 6 - 20 mg/dL   Creatinine, Ser 0.70 0.61 - 1.24 mg/dL   Calcium 8.1 (L) 8.9 - 10.3 mg/dL   Total Protein 6.0 (L) 6.5 - 8.1 g/dL  Albumin 3.2 (L) 3.5 - 5.0 g/dL   AST 15 15 - 41 U/L   ALT 30 0 - 44 U/L   Alkaline Phosphatase 56 38 - 126 U/L   Total Bilirubin 1.5 (H) 0.3 - 1.2 mg/dL   GFR calc non Af Amer >60 >60 mL/min   GFR calc Af Amer >60 >60 mL/min   Anion gap 7 5 - 15    Comment: Performed at [facility], [place] [address] [place], [place] [address]  Magnesium     Status: None   Collection Time: 11/06/18  3:16 AM  Result Value Ref Range   Magnesium 2.2 1.7 - 2.4 mg/dL    Comment: Performed at [facility], [place] [address] [place], [place] [address]  Lipase, blood     Status: Abnormal   Collection Time: 11/06/18  3:16 AM  Result Value Ref Range   Lipase 73 (H) 11 - 51 U/L    Comment: Performed at [facility], [place] [address], [place], [place] [address]  TSH     Status: None   Collection Time: 11/06/18  3:16 AM  Result Value Ref Range   TSH 1.512 0.350 - 4.500 uIU/mL    Comment: Performed by a 3rd Generation assay with a functional sensitivity of <=0.01 uIU/mL. Performed at [facility], [place] [address] [place], [place]  [address]     Imaging / Studies: Dg Chest 1 View  Result Date: 11/03/2018 CLINICAL DATA:  Epigastric abdominal pain. EXAM: CHEST  1 VIEW COMPARISON:  None. FINDINGS: The heart size and mediastinal contours are within normal limits. Both lungs are clear. No pneumothorax or pleural effusion is noted. The visualized skeletal structures are unremarkable. IMPRESSION: No active disease. Electronically Signed   By: Marijo Conception, M.D.   On: 11/03/2018 10:26   Ct Abdomen Pelvis W Contrast  Result Date: 11/03/2018 CLINICAL DATA:  Abdominal pain and distention today EXAM: CT ABDOMEN AND PELVIS WITH CONTRAST TECHNIQUE: Multidetector CT imaging of the abdomen and pelvis was performed using the standard protocol following bolus administration of intravenous contrast. CONTRAST:  11mL ISOVUE-300 IOPAMIDOL (ISOVUE-300) INJECTION 61% COMPARISON:  None. FINDINGS: Lower chest: The lung bases are clear. The heart is within normal limits in size. No pericardial effusion is seen. There is a moderate amount of epicardial fat present. Hepatobiliary: The liver enhances with no focal abnormality. No ductal dilatation is seen. The gallbladder is well seen with no calcified gallstones noted. Pancreas: There is strandiness around the descending duodenum and head of the pancreas with some fluid layering in the anterior pararenal space extending caudally. These findings are most consistent with pancreatitis although and adjacent descending duodenal duct ulceration can not be excluded. There is some prominence of the common bile duct most likely due to the adjacent inflammatory process. The pancreatic duct is not dilated. Some fluid also is present layering in the anterior pararenal space on the left. No definite mass is seen. Fluid also extends into the extraperitoneal spaces anteriorly. Spleen: The spleen is unremarkable. Adrenals/Urinary Tract: The adrenal glands appear normal. The kidneys enhance with no calculus or mass and on delayed  images, no hydronephrosis is seen. The ureters appear normal in caliber. The urinary bladder is moderately distended with no abnormality noted. Stomach/Bowel: The stomach is largely decompressed with no significant abnormality other than the inflammation surrounding the descending duodenum possibly related to ulceration or adjacent pancreatitis. No small bowel distention or edema is seen. The colon is  largely decompressed. The terminal ileum and the appendix are unremarkable. Vascular/Lymphatic: No vascular abnormality is seen. No adenopathy is noted. A 9 mm para-aortic node is present on image 69 series 2. Reproductive: The prostate is normal in size. Other: There is a small umbilical hernia containing only fat. Musculoskeletal: The lumbar vertebrae are in normal alignment with normal intervertebral disc spaces. The SI joints appear well corticated. IMPRESSION: 1. Inflammatory process in the region of the head of the pancreas and descending duodenum most consistent with acute pancreatitis with pancreatic exudate. A descending duodenal ulceration would be difficult to exclude. However pancreatitis is favored as the etiology. 2. The appendix and terminal ileum are unremarkable. 3. Small umbilical hernia containing only fat. Electronically Signed   By: Ivar Drape M.D.   On: 11/03/2018 11:04   US Abdomen Limited Ruq  Result Date: 11/03/2018 CLINICAL DATA:  Pancreatic inflammation/pancreatitis on CT performed earlier today. EXAM: ULTRASOUND ABDOMEN LIMITED RIGHT UPPER QUADRANT COMPARISON:  CT, 11/03/2018 FINDINGS: Gallbladder: Small dependent mobile gallstones. No gallbladder wall thickening or pericholecystic fluid. Common bile duct: Diameter: 5 mm Liver: Increased parenchymal echogenicity. No mass or focal lesion. Portal vein is patent on color Doppler imaging with normal direction of blood flow towards the liver. IMPRESSION: 1. Cholelithiasis without evidence of acute cholecystitis. No bile duct dilation. 2.  Hepatic steatosis. Electronically Signed   By: Lajean Manes M.D.   On: 11/03/2018 15:27    Medications / Allergies: per chart  Antibiotics: Anti-infectives (From admission, onward)   None        Note: Portions of this report may have been transcribed using voice recognition software. Every effort was made to ensure accuracy; however, inadvertent computerized transcription errors may be present.   Any transcriptional errors that result from this process are unintentional.    Adin Hector, MD, FACS, MASCRS Gastrointestinal and Minimally Invasive Surgery    [address] [place],  [address] [phone] Main / Paging [phone] Fax   11/06/2018

## 2018-11-06 NOTE — Progress Notes (Signed)
PROGRESS NOTE  [name] KGY:[id number] DOB: 1966/10/17 DOA: 11/03/2018 PCP: Patient, No Pcp Per  HPI/Recap of past 24 hours:  He tolerated a little chicken broth today Ab pain is improving, no vomiting, no fever Wife and daughter at bedside  Assessment/Plan: Principal Problem:   Gallstone pancreatitis Active Problems:   Leg cramps   Osteoarthritis   Symptomatic cholelithiasis   Obesity, Class III, BMI 40-49.9 (morbid obesity) (HCC)   LFT elevation  Acute pancreatitis triglyceride 47 -report drinking vodaka 2-3 times a week + gallstone  Abdominal distension, will get KUB  Leukocytosis New on 1/11 No fever, wbc improving  Elevated lft Normalized  GERD: start ppi  Morbid obesity: Body mass index is 47.25 kg/m.    Code Status: full  Family Communication: patient and wife  Disposition Plan: not ready to discharge   Consultants:  General surgery  Procedures:  none  Antibiotics:  none   Objective: BP 129/75   Pulse (!) 103   Temp 98.2 F (36.8 C) (Oral)   Resp 18   Ht 6\' 3"  (1.905 m)   Wt (!) 171.5 kg   SpO2 94%   BMI 47.25 kg/m   Intake/Output Summary (Last 24 hours) at 11/06/2018 1251 Last data filed at 11/06/2018 1030 Gross per 24 hour  Intake 1619.91 ml  Output -  Net 1619.91 ml   Filed Weights   11/03/18 0855 11/03/18 1430  Weight: (!) 171.5 kg (!) 171.5 kg    Exam: Patient is examined daily including today on 11/06/2018, exams remain the same as of yesterday except that has changed    General:  NAD  Cardiovascular: slight sinus tachcyardia  Respiratory: CTABL  Abdomen: protuberant, epigastric tenderness, mild guarding, no rebound,  positive BS  Musculoskeletal: No Edema  Neuro: alert, oriented   Data Reviewed: Basic Metabolic Panel: Recent Labs  Lab 11/03/18 0931 11/03/18 1532 11/04/18 0523 11/05/18 0423 11/06/18 0316  NA 139  --  138 135 135  K 3.9  --  4.0 3.7 3.4*  CL 110  --  107 106 105  CO2 23  --   25 20* 23  GLUCOSE 156*  --  143* 119* 125*  BUN 23*  --  18 16 17   CREATININE 0.83 0.81 0.80 0.70 0.70  CALCIUM 8.7*  --  8.5* 8.0* 8.1*  MG  --   --  2.1  --  2.2   Liver Function Tests: Recent Labs  Lab 11/03/18 0931 11/05/18 0423 11/06/18 0316  AST 104* 18 15  ALT 85* 44 30  ALKPHOS 58 56 56  BILITOT 1.1 1.2 1.5*  PROT 6.8 6.1* 6.0*  ALBUMIN 4.0 3.3* 3.2*   Recent Labs  Lab 11/03/18 0931 11/04/18 0523 11/05/18 0423 11/06/18 0316  LIPASE 6,447* 590* 186* 73*   No results for input(s): AMMONIA in the last 168 hours. CBC: Recent Labs  Lab 11/03/18 0931 11/03/18 1532 11/05/18 0423 11/06/18 0316  WBC 7.5 10.2 18.7* 14.6*  NEUTROABS  --   --   --  12.4*  HGB 15.1 14.9 14.4 13.4  HCT 46.1 46.4 44.3 41.6  MCV 93.9 97.3 96.5 96.5  PLT 221 212 201 189   Cardiac Enzymes:   No results for input(s): CKTOTAL, CKMB, CKMBINDEX, TROPONINI in the last 168 hours. BNP (last 3 results) No results for input(s): BNP in the last 8760 hours.  ProBNP (last 3 results) No results for input(s): PROBNP in the last 8760 hours.  CBG: No results for input(s): GLUCAP in  the last 168 hours.  No results found for this or any previous visit (from the past 240 hour(s)).   Studies: No results found.  Scheduled Meds: . [START ON 11/07/2018] acetaminophen  1,000 mg Oral On Call to OR  . [START ON 11/07/2018] celecoxib  200 mg Oral On Call to OR  . Chlorhexidine Gluconate Cloth  6 each Topical Once   And  . Chlorhexidine Gluconate Cloth  6 each Topical Once  . enoxaparin (LOVENOX) injection  80 mg Subcutaneous Q24H  . [START ON 11/07/2018] gabapentin  300 mg Oral On Call to OR    Continuous Infusions: . sodium chloride 75 mL/hr at 11/06/18 0600  . [START ON 11/07/2018]  ceFAZolin (ANCEF) IV     And  . [START ON 11/07/2018] metronidazole       Time spent: 71mins, case discussed with general surgery I have personally reviewed and interpreted on  11/06/2018 daily labs, imagings as  discussed above under date review session and assessment and plans.  I reviewed all nursing notes, pharmacy notes, vitals, pertinent old records  I have discussed plan of care as described above with RN , patient and family on 11/06/2018   Florencia Reasons MD, PhD  [facility] Pager [phone]. If 7PM-7AM, please contact night-coverage at [email or web address], password [facility] 11/06/2018, 12:51 PM  LOS: 3 days

## 2018-11-07 ENCOUNTER — Encounter (HOSPITAL_COMMUNITY)
Admission: EM | Disposition: A | Discharge status: Home / Self Care | Payer: Self-pay | Source: Home / Self Care | Attending: Internal Medicine

## 2018-11-07 ENCOUNTER — Inpatient Hospital Stay (HOSPITAL_COMMUNITY): Payer: BLUE CROSS/BLUE SHIELD | Admitting: Certified Registered Nurse Anesthetist

## 2018-11-07 DIAGNOSIS — K567 Ileus, unspecified: Secondary | ICD-10-CM

## 2018-11-07 LAB — COMPREHENSIVE METABOLIC PANEL
ALT: 25 U/L (ref 0–44)
AST: 18 U/L (ref 15–41)
Albumin: 2.9 g/dL — ABNORMAL LOW (ref 3.5–5.0)
Alkaline Phosphatase: 57 U/L (ref 38–126)
Anion gap: 10 (ref 5–15)
BUN: 19 mg/dL (ref 6–20)
CO2: 25 mmol/L (ref 22–32)
Calcium: 8.2 mg/dL — ABNORMAL LOW (ref 8.9–10.3)
Chloride: 103 mmol/L (ref 98–111)
Creatinine, Ser: 0.85 mg/dL (ref 0.61–1.24)
GFR calc Af Amer: >60 mL/min (ref >60)
GFR calc non Af Amer: >60 mL/min (ref >60)
Glucose, Bld: 132 mg/dL — ABNORMAL HIGH (ref 70–99)
Potassium: 3.7 mmol/L (ref 3.5–5.1)
Sodium: 138 mmol/L (ref 135–145)
Total Bilirubin: 1.6 mg/dL — ABNORMAL HIGH (ref 0.3–1.2)
Total Protein: 6.1 g/dL — ABNORMAL LOW (ref 6.5–8.1)

## 2018-11-07 LAB — CBC WITH DIFFERENTIAL/PLATELET
Abs Immature Granulocytes: 0.08 10*3/uL — ABNORMAL HIGH (ref 0.00–0.07)
Basophils Absolute: 0 10*3/uL (ref 0.0–0.1)
Basophils Relative: 0 %
EOS PCT: 1 %
Eosinophils Absolute: 0.2 10*3/uL (ref 0.0–0.5)
HCT: 44.4 % (ref 39.0–52.0)
Hemoglobin: 14.3 g/dL (ref 13.0–17.0)
Immature Granulocytes: 1 %
Lymphocytes Relative: 6 %
Lymphs Abs: 0.8 10*3/uL (ref 0.7–4.0)
MCH: 30.3 pg (ref 26.0–34.0)
MCHC: 32.2 g/dL (ref 30.0–36.0)
MCV: 94.1 fL (ref 80.0–100.0)
MONO ABS: 1.2 10*3/uL — AB (ref 0.1–1.0)
Monocytes Relative: 9 %
Neutro Abs: 11 10*3/uL — ABNORMAL HIGH (ref 1.7–7.7)
Neutrophils Relative %: 83 %
Platelets: 244 10*3/uL (ref 150–400)
RBC: 4.72 MIL/uL (ref 4.22–5.81)
RDW: 13.3 % (ref 11.5–15.5)
WBC: 13.3 10*3/uL — ABNORMAL HIGH (ref 4.0–10.5)
nRBC: 0 % (ref 0.0–0.2)

## 2018-11-07 LAB — HEPATITIS PANEL, ACUTE
Hep A IgM: NEGATIVE
Hep B C IgM: NEGATIVE
Hepatitis B Surface Ag: NEGATIVE

## 2018-11-07 LAB — LIPASE, BLOOD: Lipase: 40 U/L (ref 11–51)

## 2018-11-07 LAB — SURGICAL PCR SCREEN
MRSA, PCR: NEGATIVE
Staphylococcus aureus: NEGATIVE

## 2018-11-07 SURGERY — LAPAROSCOPIC CHOLECYSTECTOMY WITH INTRAOPERATIVE CHOLANGIOGRAM
Anesthesia: General

## 2018-11-07 MED ORDER — SODIUM CHLORIDE 0.9% FLUSH: 10.0000 mL | INTRAVENOUS | Status: DC | PRN

## 2018-11-07 MED ORDER — MUPIROCIN 2 % EX OINT
1.0000 "application " | TOPICAL_OINTMENT | Freq: Two times a day (BID) | CUTANEOUS | Status: DC
  Administered 2018-11-08: 1 via NASAL
  Filled 2018-11-07: qty 22

## 2018-11-07 MED ORDER — HYDROMORPHONE HCL 1 MG/ML IJ SOLN
0.5000 mg | INTRAMUSCULAR | Status: DC | PRN
  Administered 2018-11-07 – 2018-11-08 (×4): 0.5 mg via INTRAVENOUS
  Filled 2018-11-07 (×4): qty 0.5

## 2018-11-07 MED ORDER — MORPHINE SULFATE (PF) 2 MG/ML IV SOLN
1.0000 mg | INTRAVENOUS | Status: DC | PRN
  Administered 2018-11-07: 1 mg via INTRAVENOUS
  Filled 2018-11-07: qty 1

## 2018-11-07 NOTE — Anesthesia Preprocedure Evaluation (Signed)
Anesthesia Evaluation    Reviewed: Allergy & Precautions, H&P , Patient's Chart, lab work & pertinent test results  Airway        Dental   Pulmonary former smoker,           Cardiovascular Exercise Tolerance: Good negative cardio ROS    EKG 11-03-2018 SR R 77   Neuro/Psych negative neurological ROS  negative psych ROS   GI/Hepatic negative GI ROS, Neg liver ROS,   Endo/Other  negative endocrine ROSMorbid obesity  Renal/GU negative Renal ROS  negative genitourinary   Musculoskeletal   Abdominal (+) + obese,   Peds  Hematology negative hematology ROS (+)   Anesthesia Other Findings   Reproductive/Obstetrics negative OB ROS                             Lab Results  Component Value Date   CREATININE 0.85 11/07/2018   BUN 19 11/07/2018   NA 138 11/07/2018   K 3.7 11/07/2018   CL 103 11/07/2018   CO2 25 11/07/2018   Lab Results  Component Value Date   WBC 13.3 (H) 11/07/2018   HGB 14.3 11/07/2018   HCT 44.4 11/07/2018   MCV 94.1 11/07/2018   PLT 244 11/07/2018    Anesthesia Physical Anesthesia Plan  ASA: III  Anesthesia Plan: General   Post-op Pain Management:    Induction: Intravenous  PONV Risk Score and Plan: Treatment may vary due to age or medical condition, Dexamethasone and Ondansetron  Airway Management Planned: Oral ETT  Additional Equipment:   Intra-op Plan:   Post-operative Plan:   Informed Consent:   Dental advisory given  Plan Discussed with:   Anesthesia Plan Comments:         Anesthesia Quick Evaluation

## 2018-11-07 NOTE — Progress Notes (Signed)
At [place], Albert Reasons, MD was notified regarding the ABD x-ray showing a SBO.

## 2018-11-07 NOTE — Progress Notes (Signed)
Florencia Reasons, MD was paged regarding the pt's pain meds. The pt was ordered PRN IV Dilaudid, which was changed to PRN IV Morphine. The pt reported that the Morphine does not help as much as the Dilaudid and requested that I reach out to the MD.   Florencia Reasons, MD returned my page and put an order in for Dilaudid.

## 2018-11-07 NOTE — Progress Notes (Signed)
Patient ID: [name], male   DOB: [birth year], 53 y.o.   MRN: [id number]    Day of Surgery  Subjective: Patient still with abdominal pain rather diffusely, but greatest in upper abdomen.  No flatus, no BM since last Wednesday prior to admit.  Some nausea and 1 episode of emesis yesterday.  Had film yesterday that revealed ileus.  Objective: Vital signs in last 24 hours: Temp:  [98.3 F (36.8 C)-98.6 F (37 C)] 98.6 F (37 C) (01/13 0556) Pulse Rate:  [105-111] 111 (01/13 0556) Resp:  [16-17] 16 (01/13 0556) BP: (125-136)/(76-90) 125/76 (01/13 0556) SpO2:  [95 %-96 %] 96 % (01/13 0556) Last BM Date: 11/01/18  Intake/Output from previous day: 01/12 0701 - 01/13 0700 In: 1915.2 [P.O.:440; I.V.:1475.2] Out: 200 [Emesis/NG output:200] Intake/Output this shift: No intake/output data recorded.  PE: Heart: regular, but mildly tachy Lungs: CTAB Abd: morbidly obese, fullness specifically in upper abdomen, some tenderness diffusely, but greatest across upper abdomen.  Hypoactive BS  Lab Results:  Recent Labs    11/06/18 0316 11/07/18 0357  WBC 14.6* 13.3*  HGB 13.4 14.3  HCT 41.6 44.4  PLT 189 244   BMET Recent Labs    11/06/18 0316 11/07/18 0357  NA 135 138  K 3.4* 3.7  CL 105 103  CO2 23 25  GLUCOSE 125* 132*  BUN 17 19  CREATININE 0.70 0.85  CALCIUM 8.1* 8.2*   PT/INR No results for input(s): LABPROT, INR in the last 72 hours. CMP     Component Value Date/Time   NA 138 11/07/2018 0357   K 3.7 11/07/2018 0357   CL 103 11/07/2018 0357   CO2 25 11/07/2018 0357   GLUCOSE 132 (H) 11/07/2018 0357   BUN 19 11/07/2018 0357   CREATININE 0.85 11/07/2018 0357   CALCIUM 8.2 (L) 11/07/2018 0357   PROT 6.1 (L) 11/07/2018 0357   ALBUMIN 2.9 (L) 11/07/2018 0357   AST 18 11/07/2018 0357   ALT 25 11/07/2018 0357   ALKPHOS 57 11/07/2018 0357   BILITOT 1.6 (H) 11/07/2018 0357   GFRNONAA >60 11/07/2018 0357   GFRAA >60 11/07/2018 0357   Lipase     Component Value  Date/Time   LIPASE 40 11/07/2018 0357       Studies/Results: Dg Abd 1 View  Result Date: 11/06/2018 CLINICAL DATA:  Abdominal pain. Schedule for gallbladder surgery on 11/07/2018 EXAM: ABDOMEN - 1 VIEW COMPARISON:  CT 11/03/2018 FINDINGS: Gas-filled dilated mid abdominal small bowel with scattered gas and stool within the relatively decompressed colon. Changes suggest small bowel obstruction. No radiopaque stones. Visualized bones appear intact. IMPRESSION: Dilated gas-filled mid abdominal small bowel suggesting small bowel obstruction. Electronically Signed   By: Lucienne Capers M.D.   On: 11/06/2018 20:57    Anti-infectives: Anti-infectives (From admission, onward)   Start     Dose/Rate Route Frequency Ordered Stop   11/07/18 0600  ceFAZolin (ANCEF) 3 g in dextrose 5 % 50 mL IVPB  Status:  Discontinued     3 g 100 mL/hr over 30 Minutes Intravenous On call to O.R. 11/06/18 1240 11/07/18 0958   11/07/18 0600  metroNIDAZOLE (FLAGYL) IVPB 500 mg  Status:  Discontinued     500 mg 100 mL/hr over 60 Minutes Intravenous On call to O.R. 11/06/18 1240 11/07/18 0958       Assessment/Plan  Biliary pancreatitis -patient still with quite a bit of pain across upper abdomen.  Still requiring a fair amount of pain medication secondary to persistent pain. -ileus  on films yesterday -some N/V yesterday.  Decrease to NPO with ice and sips of clears to help with ileus and pancreatitis -check him on a daily basis to determine timing of an operation, if we can proceed this admission. -no abx needed as no evidence of cholecystitis. -cont to follow daily -discussed mobilization with the patient today and need to ambulate 3-4x/day at least   Morbid obesity BMI - 47  FEN - NPO, ice, sips VTE - Lovenox 80mg  ID - none needed currently   LOS: 4 days    Henreitta Cea , [facility] 11/07/2018, 9:58 AM Pager: [phone]

## 2018-11-07 NOTE — Progress Notes (Signed)
Pt refused Miralax and was educated. Pt reported that Miralax makes him vomit.

## 2018-11-07 NOTE — Progress Notes (Signed)
Patients KUB results finalized, probable small bowel obstruction, pt is currently npo for surgery this am, no emesis this shift, had 2 episodes of hiccups, abd distended with hypo BSx4, no flatus passed per patient, [facility] on call notified via text message that kub was resulted and asked if any additional orders are needed, will continue to look for new orders. Will continue to medicate patient for pain with IV dilaudid (mid-line currently being placed due to loss of iv access and difficult stick)

## 2018-11-07 NOTE — Progress Notes (Signed)
Pt has started passing gas. Bowel sounds are still hypoactive. Pt has ambulated twice during my shift. I will encourage the pt to sit up in the chair and continue to ambulate.

## 2018-11-07 NOTE — Progress Notes (Signed)
At [facility], MD was notified regarding the pt having a medium formed BM.

## 2018-11-07 NOTE — H&P (View-Only) (Signed)
Patient ID: [name], male   DOB: [birth year], 53 y.o.   MRN: [id number]    Day of Surgery  Subjective: Patient still with abdominal pain rather diffusely, but greatest in upper abdomen.  No flatus, no BM since last Wednesday prior to admit.  Some nausea and 1 episode of emesis yesterday.  Had film yesterday that revealed ileus.  Objective: Vital signs in last 24 hours: Temp:  [98.3 F (36.8 C)-98.6 F (37 C)] 98.6 F (37 C) (01/13 0556) Pulse Rate:  [105-111] 111 (01/13 0556) Resp:  [16-17] 16 (01/13 0556) BP: (125-136)/(76-90) 125/76 (01/13 0556) SpO2:  [95 %-96 %] 96 % (01/13 0556) Last BM Date: 11/01/18  Intake/Output from previous day: 01/12 0701 - 01/13 0700 In: 1915.2 [P.O.:440; I.V.:1475.2] Out: 200 [Emesis/NG output:200] Intake/Output this shift: No intake/output data recorded.  PE: Heart: regular, but mildly tachy Lungs: CTAB Abd: morbidly obese, fullness specifically in upper abdomen, some tenderness diffusely, but greatest across upper abdomen.  Hypoactive BS  Lab Results:  Recent Labs    11/06/18 0316 11/07/18 0357  WBC 14.6* 13.3*  HGB 13.4 14.3  HCT 41.6 44.4  PLT 189 244   BMET Recent Labs    11/06/18 0316 11/07/18 0357  NA 135 138  K 3.4* 3.7  CL 105 103  CO2 23 25  GLUCOSE 125* 132*  BUN 17 19  CREATININE 0.70 0.85  CALCIUM 8.1* 8.2*   PT/INR No results for input(s): LABPROT, INR in the last 72 hours. CMP     Component Value Date/Time   NA 138 11/07/2018 0357   K 3.7 11/07/2018 0357   CL 103 11/07/2018 0357   CO2 25 11/07/2018 0357   GLUCOSE 132 (H) 11/07/2018 0357   BUN 19 11/07/2018 0357   CREATININE 0.85 11/07/2018 0357   CALCIUM 8.2 (L) 11/07/2018 0357   PROT 6.1 (L) 11/07/2018 0357   ALBUMIN 2.9 (L) 11/07/2018 0357   AST 18 11/07/2018 0357   ALT 25 11/07/2018 0357   ALKPHOS 57 11/07/2018 0357   BILITOT 1.6 (H) 11/07/2018 0357   GFRNONAA >60 11/07/2018 0357   GFRAA >60 11/07/2018 0357   Lipase     Component Value  Date/Time   LIPASE 40 11/07/2018 0357       Studies/Results: Dg Abd 1 View  Result Date: 11/06/2018 CLINICAL DATA:  Abdominal pain. Schedule for gallbladder surgery on 11/07/2018 EXAM: ABDOMEN - 1 VIEW COMPARISON:  CT 11/03/2018 FINDINGS: Gas-filled dilated mid abdominal small bowel with scattered gas and stool within the relatively decompressed colon. Changes suggest small bowel obstruction. No radiopaque stones. Visualized bones appear intact. IMPRESSION: Dilated gas-filled mid abdominal small bowel suggesting small bowel obstruction. Electronically Signed   By: Lucienne Capers M.D.   On: 11/06/2018 20:57    Anti-infectives: Anti-infectives (From admission, onward)   Start     Dose/Rate Route Frequency Ordered Stop   11/07/18 0600  ceFAZolin (ANCEF) 3 g in dextrose 5 % 50 mL IVPB  Status:  Discontinued     3 g 100 mL/hr over 30 Minutes Intravenous On call to O.R. 11/06/18 1240 11/07/18 0958   11/07/18 0600  metroNIDAZOLE (FLAGYL) IVPB 500 mg  Status:  Discontinued     500 mg 100 mL/hr over 60 Minutes Intravenous On call to O.R. 11/06/18 1240 11/07/18 0958       Assessment/Plan  Biliary pancreatitis -patient still with quite a bit of pain across upper abdomen.  Still requiring a fair amount of pain medication secondary to persistent pain. -ileus  on films yesterday -some N/V yesterday.  Decrease to NPO with ice and sips of clears to help with ileus and pancreatitis -check him on a daily basis to determine timing of an operation, if we can proceed this admission. -no abx needed as no evidence of cholecystitis. -cont to follow daily -discussed mobilization with the patient today and need to ambulate 3-4x/day at least   Morbid obesity BMI - 47  FEN - NPO, ice, sips VTE - Lovenox 80mg  ID - none needed currently   LOS: 4 days    Henreitta Cea , [facility] 11/07/2018, 9:58 AM Pager: [phone]

## 2018-11-07 NOTE — Progress Notes (Addendum)
PROGRESS NOTE  [name] ZOX:[id number] DOB: Jul 29, 1966 DOA: 11/03/2018 PCP: Patient, No Pcp Per  HPI/Recap of past 24 hours:   Planned surgery cancelled due to ileus, he walked once today, no vomiting Wife and daughter at bedside  Assessment/Plan: Principal Problem:   Gallstone pancreatitis Active Problems:   Leg cramps   Osteoarthritis   Symptomatic cholelithiasis   Obesity, Class III, BMI 40-49.9 (morbid obesity) (HCC)   LFT elevation   Acute pancreatitis  Acute pancreatitis (presenting symptom) -triglyceride 47 -report drinking vodaka 2-3 times a week + gallstone Lipase now normalized ( down from 6447) -general surgery consulted   Ileus vs SBO  KUB obtained on 1/12 due to Abdominal distension on exam, KUB"Dilated gas-filled mid abdominal small bowel suggesting small bowel obstruction" Ambulate, keep mag.2, k>4 General surgery following  Leukocytosis New on 1/11 No fever, wbc improving  Elevated lft Normalized Hepatitis panel negative ,hiv negative  GERD: start ppi  Morbid obesity: Body mass index is 47.25 kg/m.    Code Status: full  Family Communication: patient and wife  Disposition Plan: not ready to discharge   Consultants:  General surgery  Procedures:  none  Antibiotics:  none   Objective: BP 125/76 (BP Location: Right Arm)   Pulse (!) 111   Temp 98.6 F (37 C)   Resp 16   Ht 6\' 3"  (1.905 m)   Wt (!) 171.5 kg   SpO2 96%   BMI 47.25 kg/m   Intake/Output Summary (Last 24 hours) at 11/07/2018 0748 Last data filed at 11/07/2018 0600 Gross per 24 hour  Intake 1915.16 ml  Output 200 ml  Net 1715.16 ml   Filed Weights   11/03/18 0855 11/03/18 1430  Weight: (!) 171.5 kg (!) 171.5 kg    Exam: Patient is examined daily including today on 11/07/2018, exams remain the same as of yesterday except that has changed    General:  NAD  Cardiovascular: slight sinus tachcyardia  Respiratory: CTABL  Abdomen: protuberant,  epigastric tenderness, mild guarding, no rebound,  Sluggish  BS  Musculoskeletal: No Edema  Neuro: alert, oriented   Data Reviewed: Basic Metabolic Panel: Recent Labs  Lab 11/03/18 0931 11/03/18 1532 11/04/18 0523 11/05/18 0423 11/06/18 0316 11/07/18 0357  NA 139  --  138 135 135 138  K 3.9  --  4.0 3.7 3.4* 3.7  CL 110  --  107 106 105 103  CO2 23  --  25 20* 23 25  GLUCOSE 156*  --  143* 119* 125* 132*  BUN 23*  --  18 16 17 19   CREATININE 0.83 0.81 0.80 0.70 0.70 0.85  CALCIUM 8.7*  --  8.5* 8.0* 8.1* 8.2*  MG  --   --  2.1  --  2.2  --    Liver Function Tests: Recent Labs  Lab 11/03/18 0931 11/05/18 0423 11/06/18 0316 11/07/18 0357  AST 104* 18 15 18   ALT 85* 44 30 25  ALKPHOS 58 56 56 57  BILITOT 1.1 1.2 1.5* 1.6*  PROT 6.8 6.1* 6.0* 6.1*  ALBUMIN 4.0 3.3* 3.2* 2.9*   Recent Labs  Lab 11/03/18 0931 11/04/18 0523 11/05/18 0423 11/06/18 0316 11/07/18 0357  LIPASE 6,447* 590* 186* 73* 40   No results for input(s): AMMONIA in the last 168 hours. CBC: Recent Labs  Lab 11/03/18 0931 11/03/18 1532 11/05/18 0423 11/06/18 0316 11/07/18 0357  WBC 7.5 10.2 18.7* 14.6* 13.3*  NEUTROABS  --   --   --  12.4* 11.0*  HGB 15.1 14.9 14.4 13.4 14.3  HCT 46.1 46.4 44.3 41.6 44.4  MCV 93.9 97.3 96.5 96.5 94.1  PLT 221 212 201 189 244   Cardiac Enzymes:   No results for input(s): CKTOTAL, CKMB, CKMBINDEX, TROPONINI in the last 168 hours. BNP (last 3 results) No results for input(s): BNP in the last 8760 hours.  ProBNP (last 3 results) No results for input(s): PROBNP in the last 8760 hours.  CBG: No results for input(s): GLUCAP in the last 168 hours.  No results found for this or any previous visit (from the past 240 hour(s)).   Studies: Dg Abd 1 View  Result Date: 11/06/2018 CLINICAL DATA:  Abdominal pain. Schedule for gallbladder surgery on 11/07/2018 EXAM: ABDOMEN - 1 VIEW COMPARISON:  CT 11/03/2018 FINDINGS: Gas-filled dilated mid abdominal small  bowel with scattered gas and stool within the relatively decompressed colon. Changes suggest small bowel obstruction. No radiopaque stones. Visualized bones appear intact. IMPRESSION: Dilated gas-filled mid abdominal small bowel suggesting small bowel obstruction. Electronically Signed   By: Lucienne Capers M.D.   On: 11/06/2018 20:57    Scheduled Meds: . acetaminophen  1,000 mg Oral On Call to OR  . celecoxib  200 mg Oral On Call to OR  . Chlorhexidine Gluconate Cloth  6 each Topical Once  . enoxaparin (LOVENOX) injection  80 mg Subcutaneous Q24H  . gabapentin  300 mg Oral On Call to OR  . pantoprazole  40 mg Oral BID  . polyethylene glycol  17 g Oral Daily  . senna-docusate  1 tablet Oral BID    Continuous Infusions: . sodium chloride 75 mL/hr at 11/07/18 0200  .  ceFAZolin (ANCEF) IV     And  . metronidazole       Time spent: 25mins, case discussed with general surgery I have personally reviewed and interpreted on  11/07/2018 daily labs, imagings as discussed above under date review session and assessment and plans.  I reviewed all nursing notes, pharmacy notes, vitals, pertinent old records  I have discussed plan of care as described above with RN , patient and family on 11/07/2018   Florencia Reasons MD, PhD  [facility] Pager [phone]. If 7PM-7AM, please contact night-coverage at [email or web address], password [facility] 11/07/2018, 7:48 AM  LOS: 4 days

## 2018-11-08 ENCOUNTER — Encounter (HOSPITAL_COMMUNITY)
Admission: EM | Disposition: A | Discharge status: Home / Self Care | Payer: Self-pay | Source: Home / Self Care | Attending: Internal Medicine

## 2018-11-08 ENCOUNTER — Inpatient Hospital Stay (HOSPITAL_COMMUNITY): Payer: BLUE CROSS/BLUE SHIELD | Admitting: Certified Registered"

## 2018-11-08 ENCOUNTER — Inpatient Hospital Stay (HOSPITAL_COMMUNITY): Payer: BLUE CROSS/BLUE SHIELD

## 2018-11-08 DIAGNOSIS — E876 Hypokalemia: Secondary | ICD-10-CM

## 2018-11-08 HISTORY — PX: CHOLECYSTECTOMY: SHX55

## 2018-11-08 LAB — CREATININE, SERUM: CREATININE: 0.79 mg/dL (ref 0.61–1.24)

## 2018-11-08 LAB — BASIC METABOLIC PANEL
Anion gap: 10 (ref 5–15)
BUN: 19 mg/dL (ref 6–20)
CO2: 24 mmol/L (ref 22–32)
Calcium: 8.2 mg/dL — ABNORMAL LOW (ref 8.9–10.3)
Chloride: 103 mmol/L (ref 98–111)
Creatinine, Ser: 0.75 mg/dL (ref 0.61–1.24)
GFR calc Af Amer: >60 mL/min (ref >60)
GFR calc non Af Amer: >60 mL/min (ref >60)
Glucose, Bld: 117 mg/dL — ABNORMAL HIGH (ref 70–99)
Potassium: 3.1 mmol/L — ABNORMAL LOW (ref 3.5–5.1)
Sodium: 137 mmol/L (ref 135–145)

## 2018-11-08 LAB — CBC WITH DIFFERENTIAL/PLATELET
ABS IMMATURE GRANULOCYTES: 0.22 10*3/uL — AB (ref 0.00–0.07)
Abs Immature Granulocytes: 0.14 10*3/uL — ABNORMAL HIGH (ref 0.00–0.07)
Basophils Absolute: 0 10*3/uL (ref 0.0–0.1)
Basophils Absolute: 0 10*3/uL (ref 0.0–0.1)
Basophils Relative: 0 %
Basophils Relative: 0 %
Eosinophils Absolute: 0.2 10*3/uL (ref 0.0–0.5)
Eosinophils Absolute: 0.2 10*3/uL (ref 0.0–0.5)
Eosinophils Relative: 2 %
Eosinophils Relative: 2 %
HCT: 40.6 % (ref 39.0–52.0)
HCT: 40.7 % (ref 39.0–52.0)
Hemoglobin: 13 g/dL (ref 13.0–17.0)
Hemoglobin: 13.2 g/dL (ref 13.0–17.0)
Immature Granulocytes: 1 %
Immature Granulocytes: 2 %
Lymphocytes Relative: 9 %
Lymphocytes Relative: 5 %
Lymphs Abs: 1.1 10*3/uL (ref 0.7–4.0)
Lymphs Abs: 0.6 10*3/uL — ABNORMAL LOW (ref 0.7–4.0)
MCH: 29.9 pg (ref 26.0–34.0)
MCH: 30.5 pg (ref 26.0–34.0)
MCHC: 32 g/dL (ref 30.0–36.0)
MCHC: 32.4 g/dL (ref 30.0–36.0)
MCV: 93.3 fL (ref 80.0–100.0)
MCV: 94 fL (ref 80.0–100.0)
Monocytes Absolute: 1.4 10*3/uL — ABNORMAL HIGH (ref 0.1–1.0)
Monocytes Absolute: 0.9 10*3/uL (ref 0.1–1.0)
Monocytes Relative: 12 %
Monocytes Relative: 7 %
NEUTROS ABS: 9 10*3/uL — AB (ref 1.7–7.7)
NEUTROS ABS: 10.4 10*3/uL — AB (ref 1.7–7.7)
NEUTROS PCT: 84 %
Neutrophils Relative %: 76 %
Platelets: 243 10*3/uL (ref 150–400)
Platelets: 250 10*3/uL (ref 150–400)
RBC: 4.35 MIL/uL (ref 4.22–5.81)
RBC: 4.33 MIL/uL (ref 4.22–5.81)
RDW: 13.6 % (ref 11.5–15.5)
RDW: 13.7 % (ref 11.5–15.5)
WBC: 11.8 10*3/uL — ABNORMAL HIGH (ref 4.0–10.5)
WBC: 12.3 10*3/uL — ABNORMAL HIGH (ref 4.0–10.5)
nRBC: 0 % (ref 0.0–0.2)
nRBC: 0 % (ref 0.0–0.2)

## 2018-11-08 LAB — HEPATIC FUNCTION PANEL
ALT: 22 U/L (ref 0–44)
AST: 15 U/L (ref 15–41)
Albumin: 2.9 g/dL — ABNORMAL LOW (ref 3.5–5.0)
Alkaline Phosphatase: 69 U/L (ref 38–126)
Bilirubin, Direct: 0.5 mg/dL — ABNORMAL HIGH (ref 0.0–0.2)
Indirect Bilirubin: 1.3 mg/dL — ABNORMAL HIGH (ref 0.3–0.9)
Total Bilirubin: 1.8 mg/dL — ABNORMAL HIGH (ref 0.3–1.2)
Total Protein: 6 g/dL — ABNORMAL LOW (ref 6.5–8.1)

## 2018-11-08 LAB — LIPASE, BLOOD
Lipase: 42 U/L (ref 11–51)
Lipase: 42 U/L (ref 11–51)

## 2018-11-08 LAB — MAGNESIUM: MAGNESIUM: 2.1 mg/dL (ref 1.7–2.4)

## 2018-11-08 SURGERY — LAPAROSCOPIC CHOLECYSTECTOMY WITH INTRAOPERATIVE CHOLANGIOGRAM
Anesthesia: General

## 2018-11-08 MED ORDER — SODIUM CHLORIDE 0.9 % IV SOLN
2.0000 g | INTRAVENOUS | Status: DC
  Filled 2018-11-08: qty 20

## 2018-11-08 MED ORDER — MEPERIDINE HCL 50 MG/ML IJ SOLN: 6.2500 mg | INTRAMUSCULAR | Status: DC | PRN

## 2018-11-08 MED ORDER — FENTANYL CITRATE (PF) 250 MCG/5ML IJ SOLN
INTRAMUSCULAR | Status: AC
  Filled 2018-11-08: qty 5

## 2018-11-08 MED ORDER — KETAMINE HCL 10 MG/ML IJ SOLN
INTRAMUSCULAR | Status: DC | PRN
  Administered 2018-11-08 (×2): 10 mg via INTRAVENOUS

## 2018-11-08 MED ORDER — PROMETHAZINE HCL 25 MG/ML IJ SOLN: 6.2500 mg | INTRAMUSCULAR | Status: DC | PRN

## 2018-11-08 MED ORDER — ONDANSETRON HCL 4 MG/2ML IJ SOLN
INTRAMUSCULAR | Status: DC | PRN
  Administered 2018-11-08: 4 mg via INTRAVENOUS

## 2018-11-08 MED ORDER — HEPARIN SODIUM (PORCINE) 5000 UNIT/ML IJ SOLN
5000.0000 [IU] | Freq: Three times a day (TID) | INTRAMUSCULAR | Status: DC
  Administered 2018-11-09: 5000 [IU] via SUBCUTANEOUS
  Filled 2018-11-08: qty 1

## 2018-11-08 MED ORDER — SODIUM CHLORIDE 0.9 % IV SOLN
2.0000 g | INTRAVENOUS | Status: AC
  Administered 2018-11-08: 2 g via INTRAVENOUS
  Filled 2018-11-08 (×2): qty 20

## 2018-11-08 MED ORDER — OXYCODONE HCL 5 MG PO TABS: 5.0000 mg | ORAL_TABLET | Freq: Once | ORAL | Status: DC | PRN

## 2018-11-08 MED ORDER — METOPROLOL TARTRATE 5 MG/5ML IV SOLN: 5.0000 mg | Freq: Four times a day (QID) | INTRAVENOUS | Status: DC | PRN

## 2018-11-08 MED ORDER — KCL IN DEXTROSE-NACL 20-5-0.45 MEQ/L-%-% IV SOLN
INTRAVENOUS | Status: DC
  Administered 2018-11-08 – 2018-11-09 (×2): via INTRAVENOUS
  Filled 2018-11-08 (×3): qty 1000

## 2018-11-08 MED ORDER — PROPOFOL 10 MG/ML IV BOLUS
INTRAVENOUS | Status: DC | PRN
  Administered 2018-11-08: 200 mg via INTRAVENOUS

## 2018-11-08 MED ORDER — LACTATED RINGERS IV SOLN
INTRAVENOUS | Status: DC | PRN
  Administered 2018-11-08 (×2): via INTRAVENOUS

## 2018-11-08 MED ORDER — SUCCINYLCHOLINE CHLORIDE 200 MG/10ML IV SOSY
PREFILLED_SYRINGE | INTRAVENOUS | Status: DC | PRN
  Administered 2018-11-08: 120 mg via INTRAVENOUS

## 2018-11-08 MED ORDER — KETAMINE HCL 10 MG/ML IJ SOLN
INTRAMUSCULAR | Status: AC
  Filled 2018-11-08: qty 1

## 2018-11-08 MED ORDER — PROPOFOL 10 MG/ML IV BOLUS
INTRAVENOUS | Status: AC
  Filled 2018-11-08: qty 20

## 2018-11-08 MED ORDER — LIDOCAINE 2% (20 MG/ML) 5 ML SYRINGE
INTRAMUSCULAR | Status: DC | PRN
  Administered 2018-11-08: 100 mg via INTRAVENOUS

## 2018-11-08 MED ORDER — POTASSIUM CHLORIDE CRYS ER 20 MEQ PO TBCR
40.0000 meq | EXTENDED_RELEASE_TABLET | ORAL | Status: AC
  Administered 2018-11-08 (×2): 40 meq via ORAL
  Filled 2018-11-08 (×2): qty 2

## 2018-11-08 MED ORDER — IOPAMIDOL (ISOVUE-300) INJECTION 61%
INTRAVENOUS | Status: DC | PRN
  Administered 2018-11-08: 15 mL

## 2018-11-08 MED ORDER — RINGERS IRRIGATION IR SOLN
Status: DC | PRN
  Administered 2018-11-08: 1

## 2018-11-08 MED ORDER — MIDAZOLAM HCL 2 MG/2ML IJ SOLN
INTRAMUSCULAR | Status: DC | PRN
  Administered 2018-11-08 (×2): 1 mg via INTRAVENOUS

## 2018-11-08 MED ORDER — BUPIVACAINE LIPOSOME 1.3 % IJ SUSP
20.0000 mL | Freq: Once | INTRAMUSCULAR | Status: AC
  Administered 2018-11-08: 20 mL
  Filled 2018-11-08: qty 20

## 2018-11-08 MED ORDER — HYDROCODONE-ACETAMINOPHEN 7.5-325 MG PO TABS: 1.0000 | ORAL_TABLET | Freq: Once | ORAL | Status: DC | PRN

## 2018-11-08 MED ORDER — HYDROMORPHONE HCL 1 MG/ML IJ SOLN
1.0000 mg | INTRAMUSCULAR | Status: DC | PRN
  Filled 2018-11-08: qty 1

## 2018-11-08 MED ORDER — SUGAMMADEX SODIUM 500 MG/5ML IV SOLN
INTRAVENOUS | Status: DC | PRN
  Administered 2018-11-08: 500 mg via INTRAVENOUS

## 2018-11-08 MED ORDER — ROCURONIUM BROMIDE 10 MG/ML (PF) SYRINGE
PREFILLED_SYRINGE | INTRAVENOUS | Status: DC | PRN
  Administered 2018-11-08 (×2): 10 mg via INTRAVENOUS
  Administered 2018-11-08: 50 mg via INTRAVENOUS

## 2018-11-08 MED ORDER — DEXAMETHASONE SODIUM PHOSPHATE 10 MG/ML IJ SOLN
INTRAMUSCULAR | Status: DC | PRN
  Administered 2018-11-08: 8 mg via INTRAVENOUS

## 2018-11-08 MED ORDER — LABETALOL HCL 5 MG/ML IV SOLN
INTRAVENOUS | Status: DC | PRN
  Administered 2018-11-08 (×2): 2.5 mg via INTRAVENOUS

## 2018-11-08 MED ORDER — IOPAMIDOL (ISOVUE-300) INJECTION 61%
INTRAVENOUS | Status: AC
  Filled 2018-11-08: qty 50

## 2018-11-08 MED ORDER — ACETAMINOPHEN 10 MG/ML IV SOLN
1000.0000 mg | Freq: Once | INTRAVENOUS | Status: DC | PRN
  Administered 2018-11-08: 1000 mg via INTRAVENOUS

## 2018-11-08 MED ORDER — ONDANSETRON HCL 4 MG/2ML IJ SOLN: 4.0000 mg | Freq: Four times a day (QID) | INTRAMUSCULAR | Status: DC | PRN

## 2018-11-08 MED ORDER — OXYCODONE HCL 5 MG/5ML PO SOLN
5.0000 mg | Freq: Once | ORAL | Status: DC | PRN
  Filled 2018-11-08: qty 5

## 2018-11-08 MED ORDER — HYDROMORPHONE HCL 1 MG/ML IJ SOLN
INTRAMUSCULAR | Status: AC
  Filled 2018-11-08: qty 1

## 2018-11-08 MED ORDER — HYDROMORPHONE HCL 1 MG/ML IJ SOLN: 0.2500 mg | INTRAMUSCULAR | Status: DC | PRN

## 2018-11-08 MED ORDER — MIDAZOLAM HCL 2 MG/2ML IJ SOLN
INTRAMUSCULAR | Status: AC
  Filled 2018-11-08: qty 2

## 2018-11-08 MED ORDER — LIDOCAINE 2% (20 MG/ML) 5 ML SYRINGE
INTRAMUSCULAR | Status: DC | PRN
  Administered 2018-11-08: 1.5 mg/kg/h via INTRAVENOUS

## 2018-11-08 MED ORDER — ACETAMINOPHEN 10 MG/ML IV SOLN
INTRAVENOUS | Status: AC
  Administered 2018-11-08: 1000 mg via INTRAVENOUS
  Filled 2018-11-08: qty 100

## 2018-11-08 MED ORDER — HYDRALAZINE HCL 20 MG/ML IJ SOLN: 10.0000 mg | INTRAMUSCULAR | Status: DC | PRN

## 2018-11-08 MED ORDER — FUROSEMIDE 10 MG/ML IJ SOLN
40.0000 mg | Freq: Once | INTRAMUSCULAR | Status: AC
  Administered 2018-11-08: 40 mg via INTRAVENOUS
  Filled 2018-11-08: qty 4

## 2018-11-08 MED ORDER — FENTANYL CITRATE (PF) 250 MCG/5ML IJ SOLN
INTRAMUSCULAR | Status: DC | PRN
  Administered 2018-11-08: 25 ug via INTRAVENOUS
  Administered 2018-11-08 (×5): 50 ug via INTRAVENOUS

## 2018-11-08 MED ORDER — PANTOPRAZOLE SODIUM 40 MG IV SOLR
40.0000 mg | Freq: Every day | INTRAVENOUS | Status: DC
  Administered 2018-11-08: 40 mg via INTRAVENOUS
  Filled 2018-11-08: qty 40

## 2018-11-08 MED ORDER — ONDANSETRON 4 MG PO TBDP: 4.0000 mg | ORAL_TABLET | Freq: Four times a day (QID) | ORAL | Status: DC | PRN

## 2018-11-08 MED ORDER — HYDROMORPHONE HCL 1 MG/ML IJ SOLN
0.2500 mg | INTRAMUSCULAR | Status: DC | PRN
  Administered 2018-11-08: 0.5 mg via INTRAVENOUS

## 2018-11-08 SURGICAL SUPPLY — 39 items
APPLICATOR COTTON TIP 6 STRL (MISCELLANEOUS) ×2 IMPLANT
APPLICATOR COTTON TIP 6IN STRL (MISCELLANEOUS) ×2 IMPLANT
APPLIER CLIP ROT 10 11.4 M/L (STAPLE) ×2
BENZOIN TINCTURE PRP APPL 2/3 (GAUZE/BANDAGES/DRESSINGS) IMPLANT
CABLE HIGH FREQUENCY MONO STRZ (ELECTRODE) ×1 IMPLANT
CATH REDDICK CHOLANGI 4FR 50CM (CATHETERS) ×2 IMPLANT
CLIP APPLIE ROT 10 11.4 M/L (STAPLE) ×1 IMPLANT
COVER MAYO STAND STRL (DRAPES) ×2 IMPLANT
COVER SURGICAL LIGHT HANDLE (MISCELLANEOUS) ×2 IMPLANT
COVER WAND RF STERILE (DRAPES) IMPLANT
DECANTER SPIKE VIAL GLASS SM (MISCELLANEOUS) ×1 IMPLANT
DERMABOND ADVANCED (GAUZE/BANDAGES/DRESSINGS)
DERMABOND ADVANCED .7 DNX12 (GAUZE/BANDAGES/DRESSINGS) ×1 IMPLANT
DRAPE C-ARM 42X120 X-RAY (DRAPES) ×2 IMPLANT
ELECT L-HOOK LAP 45CM DISP (ELECTROSURGICAL) ×2
ELECT PENCIL ROCKER SW 15FT (MISCELLANEOUS) ×1 IMPLANT
ELECT REM PT RETURN 15FT ADLT (MISCELLANEOUS) ×2 IMPLANT
ELECTRODE L-HOOK LAP 45CM DISP (ELECTROSURGICAL) IMPLANT
GLOVE BIOGEL M 8.0 STRL (GLOVE) ×2 IMPLANT
GOWN STRL REUS W/TWL XL LVL3 (GOWN DISPOSABLE) ×5 IMPLANT
HEMOSTAT SURGICEL 4X8 (HEMOSTASIS) ×1 IMPLANT
IV CATH 14GX2 1/4 (CATHETERS) ×2 IMPLANT
KIT BASIN OR (CUSTOM PROCEDURE TRAY) ×2 IMPLANT
L-HOOK LAP DISP 36CM (ELECTROSURGICAL)
LHOOK LAP DISP 36CM (ELECTROSURGICAL) IMPLANT
POUCH RETRIEVAL ECOSAC 10 (ENDOMECHANICALS) IMPLANT
POUCH RETRIEVAL ECOSAC 10MM (ENDOMECHANICALS) ×1
SCISSORS LAP 5X45 EPIX DISP (ENDOMECHANICALS) ×2 IMPLANT
SET IRRIG TUBING LAPAROSCOPIC (IRRIGATION / IRRIGATOR) ×2 IMPLANT
SET TUBE SMOKE EVAC HIGH FLOW (TUBING) ×2 IMPLANT
SLEEVE XCEL OPT CAN 5 100 (ENDOMECHANICALS) ×2 IMPLANT
STRIP CLOSURE SKIN 1/2X4 (GAUZE/BANDAGES/DRESSINGS) IMPLANT
SUT MNCRL AB 4-0 PS2 18 (SUTURE) ×2 IMPLANT
SYR 20CC LL (SYRINGE) ×2 IMPLANT
TOWEL OR 17X26 10 PK STRL BLUE (TOWEL DISPOSABLE) ×2 IMPLANT
TRAY LAPAROSCOPIC (CUSTOM PROCEDURE TRAY) ×2 IMPLANT
TROCAR BLADELESS OPT 5 100 (ENDOMECHANICALS) ×2 IMPLANT
TROCAR XCEL BLUNT TIP 100MML (ENDOMECHANICALS) IMPLANT
TROCAR XCEL NON-BLD 11X100MML (ENDOMECHANICALS) ×2 IMPLANT

## 2018-11-08 NOTE — Progress Notes (Addendum)
PROGRESS NOTE  [name] EBR:[id number] DOB: [birth year] DOA: 11/03/2018 PCP: Patient, No Pcp Per  HPI/Recap of past 24 hours:  Ambulating in hallway, feeling better, had bm, no n/v He is npo for surgery today Wife at bedside   Assessment/Plan: Principal Problem:   Gallstone pancreatitis Active Problems:   Leg cramps   Osteoarthritis   Symptomatic cholelithiasis   Obesity, Class III, BMI 40-49.9 (morbid obesity) (HCC)   LFT elevation   Acute pancreatitis   Ileus (HCC)  Acute pancreatitis (presenting symptom) triglyceride 47 report drinking vodaka 2-3 times a week + gallstone Lipase now normalized ( down from 6447) -general surgery consulted for cholecystectomy    Ileus   KUB obtained on 1/12 due to Abdominal distension on exam, KUB"Dilated gas-filled mid abdominal small bowel suggesting small bowel obstruction" Ambulate, keep mag>2, k>4 Ileus resolved General surgery following  Leukocytosis New on 1/11, possible reactive to lleus No fever, wbc improving  Hypokalemia: replace k, repeat lab in am  Elevated lft Normalized Hepatitis panel negative ,hiv negative  GERD: started on  ppi  Morbid obesity: Body mass index is 47.25 kg/m.   Addendum: bilateral upper extremity edema, likely from fluids overload, give lasix40mg  x1, check upper extremity venous US to r/o DVT, elevate arms.  Code Status: full  Family Communication: patient and wife  Disposition Plan: home in 1-2 days with general surgery clearance    Consultants:  General surgery  Procedures:  Cholecystectomy   Antibiotics:  Perioperative    Objective: BP 136/75   Pulse 90   Temp 97.8 F (36.6 C)   Resp (!) 22   Ht 6\' 3"  (1.905 m)   Wt (!) 171.5 kg   SpO2 98%   BMI 47.25 kg/m   Intake/Output Summary (Last 24 hours) at 11/08/2018 1237 Last data filed at 11/08/2018 1000 Gross per 24 hour  Intake 1618.9 ml  Output 1150 ml  Net 468.9 ml   Filed Weights   11/03/18 0855  11/03/18 1430  Weight: (!) 171.5 kg (!) 171.5 kg    Exam: Patient is examined daily including today on 11/08/2018, exams remain the same as of yesterday except that has changed    General:  NAD  Cardiovascular: sinus tachycardia has resolved ,now NSR  Respiratory: CTABL  Abdomen: protuberant, nontender, + BS  Musculoskeletal: No Edema  Neuro: alert, oriented   Data Reviewed: Basic Metabolic Panel: Recent Labs  Lab 11/04/18 0523 11/05/18 0423 11/06/18 0316 11/07/18 0357 11/08/18 0306  NA 138 135 135 138 137  K 4.0 3.7 3.4* 3.7 3.1*  CL 107 106 105 103 103  CO2 25 20* 23 25 24   GLUCOSE 143* 119* 125* 132* 117*  BUN 18 16 17 19 19   CREATININE 0.80 0.70 0.70 0.85 0.75  CALCIUM 8.5* 8.0* 8.1* 8.2* 8.2*  MG 2.1  --  2.2  --  2.1   Liver Function Tests: Recent Labs  Lab 11/03/18 0931 11/05/18 0423 11/06/18 0316 11/07/18 0357 11/08/18 0306  AST 104* 18 15 18 15   ALT 85* 44 30 25 22   ALKPHOS 58 56 56 57 69  BILITOT 1.1 1.2 1.5* 1.6* 1.8*  PROT 6.8 6.1* 6.0* 6.1* 6.0*  ALBUMIN 4.0 3.3* 3.2* 2.9* 2.9*   Recent Labs  Lab 11/04/18 0523 11/05/18 0423 11/06/18 0316 11/07/18 0357 11/08/18 0306  LIPASE 590* 186* 73* 40 42   No results for input(s): AMMONIA in the last 168 hours. CBC: Recent Labs  Lab 11/03/18 1532 11/05/18 0423 11/06/18 0316 11/07/18 0881  11/08/18 0306  WBC 10.2 18.7* 14.6* 13.3* 11.8*  NEUTROABS  --   --  12.4* 11.0* 9.0*  HGB 14.9 14.4 13.4 14.3 13.0  HCT 46.4 44.3 41.6 44.4 40.6  MCV 97.3 96.5 96.5 94.1 93.3  PLT 212 201 189 244 243   Cardiac Enzymes:   No results for input(s): CKTOTAL, CKMB, CKMBINDEX, TROPONINI in the last 168 hours. BNP (last 3 results) No results for input(s): BNP in the last 8760 hours.  ProBNP (last 3 results) No results for input(s): PROBNP in the last 8760 hours.  CBG: No results for input(s): GLUCAP in the last 168 hours.  Recent Results (from the past 240 hour(s))  Surgical PCR screen     Status:  None   Collection Time: 11/07/18  9:45 AM  Result Value Ref Range Status   MRSA, PCR NEGATIVE NEGATIVE Final   Staphylococcus aureus NEGATIVE NEGATIVE Final    Comment: (NOTE) The Xpert SA Assay (FDA approved for NASAL specimens in patients 35 years of age and older), is one component of a comprehensive surveillance program. It is not intended to diagnose infection nor to guide or monitor treatment. Performed at [facility], [place] [address], [place], [place] [address]      Studies: No results found.  Scheduled Meds: . Chlorhexidine Gluconate Cloth  6 each Topical Once  . enoxaparin (LOVENOX) injection  80 mg Subcutaneous Q24H  . mupirocin ointment  1 application Nasal BID  . pantoprazole  40 mg Oral BID  . polyethylene glycol  17 g Oral Daily  . potassium chloride  40 mEq Oral Q4H  . senna-docusate  1 tablet Oral BID    Continuous Infusions: . sodium chloride 75 mL/hr at 11/07/18 2141  . cefTRIAXone (ROCEPHIN)  IV       Time spent: 93mins, case discussed with general surgery I have personally reviewed and interpreted on  11/08/2018 daily labs, imagings as discussed above under date review session and assessment and plans.  I reviewed all nursing notes, pharmacy notes, vitals, pertinent old records  I have discussed plan of care as described above with RN , patient and family on 11/08/2018   Florencia Reasons MD, PhD  [facility] Pager [phone]. If 7PM-7AM, please contact night-coverage at [email or web address], password [facility] 11/08/2018, 12:37 PM  LOS: 5 days

## 2018-11-08 NOTE — Transfer of Care (Signed)
Immediate Anesthesia Transfer of Care Note  Patient: [name]  Procedure(s) Performed: LAPAROSCOPIC CHOLECYSTECTOMY WITH INTRAOPERATIVE CHOLANGIOGRAM (N/A )  Patient Location: PACU  Anesthesia Type:General  Level of Consciousness: awake and alert   Airway & Oxygen Therapy: Patient Spontanous Breathing and Patient connected to face mask oxygen  Post-op Assessment: Report given to RN and Post -op Vital signs reviewed and stable  Post vital signs: Reviewed and stable  Last Vitals:  Vitals Value Taken Time  BP 147/77 11/08/2018  4:08 PM  Temp    Pulse    Resp 27 11/08/2018  4:09 PM  SpO2    Vitals shown include unvalidated device data.  Last Pain:  Vitals:   11/08/18 0935  TempSrc:   PainSc: 3       Patients Stated Pain Goal: 3 (03/88/82 8003)  Complications: No apparent anesthesia complications and Dr Smith Robert updated on pt condition after extubation on way to PACU

## 2018-11-08 NOTE — Anesthesia Procedure Notes (Signed)
Procedure Name: Intubation Date/Time: 11/08/2018 2:16 PM Performed by: Cynda Familia, CRNA Pre-anesthesia Checklist: Patient identified, Emergency Drugs available, Suction available and Patient being monitored Patient Re-evaluated:Patient Re-evaluated prior to induction Oxygen Delivery Method: Circle System Utilized Preoxygenation: Pre-oxygenation with 100% oxygen Induction Type: IV induction Ventilation: Mask ventilation without difficulty Laryngoscope Size: Miller and 2 Grade View: Grade I Tube type: Oral Number of attempts: 1 Airway Equipment and Method: Stylet Placement Confirmation: ETT inserted through vocal cords under direct vision,  positive ETCO2 and breath sounds checked- equal and bilateral Secured at: 22 cm Tube secured with: Tape Dental Injury: Teeth and Oropharynx as per pre-operative assessment  Comments: Smooth IV induction Miller--- RSI-- OK mask vent-- intubation AM CRNA atraumatic-- teeth and mouth as preop-- chipping present to front teeth prior to laryngoscopy-- pt did not report on preop interview-- dental advisory given by CRNA in preop--- bilat BS  Sabra Heck

## 2018-11-08 NOTE — Anesthesia Procedure Notes (Signed)
Date/Time: 11/08/2018 3:59 PM Performed by: Cynda Familia, CRNA Oxygen Delivery Method: Simple face mask Placement Confirmation: positive ETCO2 and breath sounds checked- equal and bilateral Dental Injury: Teeth and Oropharynx as per pre-operative assessment

## 2018-11-08 NOTE — Op Note (Signed)
[name]  Primary Care Physician:  Patient, No Pcp Per    11/08/2018  3:40 PM  Procedure: Laparoscopic Cholecystectomy with intraoperative cholangiogram  Surgeon: Catalina Antigua B. Hassell Done, MD, FACS Asst:  Saverio Danker, PA  Anes:  General  Drains:  None  Findings: Chronic cholecystitis with IOC showing a common bile/pancreatic channel explaining his biliary pancreatitis  Description of Procedure: The patient was taken to OR 4 and given general anesthesia.  The patient was prepped with PCMX and draped sterilely. A time out was performed.  Access to the abdomen was achieved with a 5 mm Optiview through the right upper quadrant.  Port placement included three 5 mm trocars and one 11 mm trocar in the upper midline.    The gallbladder was visualized and the fundus was grasped and the gallbladder was elevated. Traction on the infundibulum allowed for successful demonstration of the critical view. Inflammatory changes were chronic but there was edema from the pancreatitis and there was bile stained fluid around the liver.  The cystic duct was identified and clipped up on the gallbladder and an incision was made in the cystic duct and the Reddick catheter was inserted after milking the cystic duct of any debris. A dynamic cholangiogram was performed which demonstrated a small CBD and back flow into the pancreatic duct.    The cystic duct was then triple clipped and divided, the cystic artery was double clipped and divided and then the gallbladder was removed from the gallbladder bed. Removal of the gallbladder from the gallbladder bed was without entering it.  The gallbladder was then placed in a bag and brought out through one of the trocar sites. The gallbladder bed was inspected and no bleeding or bile leaks were seen.   Incisions were injected with Exparel and closed with 4-0 Monocryl and Dermabond on the skin.  Sponge and needle count were correct.    The patient was taken to the recovery room in  satisfactory condition.

## 2018-11-08 NOTE — Anesthesia Postprocedure Evaluation (Signed)
Anesthesia Post Note  Patient: Dentist  Procedure(s) Performed: LAPAROSCOPIC CHOLECYSTECTOMY WITH INTRAOPERATIVE CHOLANGIOGRAM (N/A )     Patient location during evaluation: PACU Anesthesia Type: General Level of consciousness: awake and alert Pain management: pain level controlled Vital Signs Assessment: post-procedure vital signs reviewed and stable Respiratory status: spontaneous breathing, nonlabored ventilation and respiratory function stable Cardiovascular status: blood pressure returned to baseline and stable Postop Assessment: no apparent nausea or vomiting Anesthetic complications: no    Last Vitals:  Vitals:   11/08/18 1929 11/08/18 1930  BP: 124/74   Pulse: 91 89  Resp: (!) 24   Temp:  36.5 C  SpO2: 94% 94%    Last Pain:  Vitals:   11/08/18 1930  TempSrc: Oral  PainSc:                  Brennan Bailey

## 2018-11-08 NOTE — Anesthesia Preprocedure Evaluation (Addendum)
Anesthesia Evaluation  Patient identified by MRN, date of birth, ID band Patient awake    Reviewed: Allergy & Precautions, H&P , NPO status , Patient's Chart, lab work & pertinent test results  Airway Mallampati: II  TM Distance: >3 FB Neck ROM: Full    Dental no notable dental hx. (+) Chipped, Dental Advisory Given   Pulmonary neg pulmonary ROS, former smoker,    Pulmonary exam normal breath sounds clear to auscultation       Cardiovascular Exercise Tolerance: Good negative cardio ROS Normal cardiovascular exam Rhythm:Regular Rate:Normal  EKG 11-03-2018 SR R 77   Neuro/Psych negative neurological ROS  negative psych ROS   GI/Hepatic negative GI ROS, Neg liver ROS,   Endo/Other  negative endocrine ROSMorbid obesity  Renal/GU negative Renal ROS  negative genitourinary   Musculoskeletal negative musculoskeletal ROS (+)   Abdominal (+) + obese,   Peds negative pediatric ROS (+)  Hematology negative hematology ROS (+)   Anesthesia Other Findings   Reproductive/Obstetrics negative OB ROS                            Lab Results  Component Value Date   CREATININE 0.75 11/08/2018   BUN 19 11/08/2018   NA 137 11/08/2018   K 3.1 (L) 11/08/2018   CL 103 11/08/2018   CO2 24 11/08/2018   Lab Results  Component Value Date   WBC 11.8 (H) 11/08/2018   HGB 13.0 11/08/2018   HCT 40.6 11/08/2018   MCV 93.3 11/08/2018   PLT 243 11/08/2018    Anesthesia Physical  Anesthesia Plan  ASA: III  Anesthesia Plan: General   Post-op Pain Management:    Induction: Intravenous  PONV Risk Score and Plan: 2 and Treatment may vary due to age or medical condition, Ondansetron and Midazolam  Airway Management Planned: Oral ETT  Additional Equipment:   Intra-op Plan:   Post-operative Plan: Extubation in OR  Informed Consent: I have reviewed the patients History and Physical, chart, labs and  discussed the procedure including the risks, benefits and alternatives for the proposed anesthesia with the patient or authorized representative who has indicated his/her understanding and acceptance.     Dental advisory given  Plan Discussed with: CRNA  Anesthesia Plan Comments:         Anesthesia Quick Evaluation

## 2018-11-08 NOTE — Interval H&P Note (Signed)
History and Physical Interval Note:  11/08/2018 1:26 PM  [name]  has presented today for surgery, with the diagnosis of billiary pancreatitis  The various methods of treatment have been discussed with the patient and family. After consideration of risks, benefits and other options for treatment, the patient has consented to  Procedure(s): LAPAROSCOPIC CHOLECYSTECTOMY WITH INTRAOPERATIVE CHOLANGIOGRAM (N/A) as a surgical intervention .  The patient's history has been reviewed, patient examined, no change in status, stable for surgery.  I have reviewed the patient's chart and labs.  Questions were answered to the patient's satisfaction.     Pedro Earls

## 2018-11-08 NOTE — Interval H&P Note (Signed)
History and Physical Interval Note:  11/08/2018 1:25 PM  [name]  has presented today for surgery, with the diagnosis of billiary pancreatitis  The various methods of treatment have been discussed with the patient and family. After consideration of risks, benefits and other options for treatment, the patient has consented to  Procedure(s): LAPAROSCOPIC CHOLECYSTECTOMY WITH INTRAOPERATIVE CHOLANGIOGRAM (N/A) as a surgical intervention .  The patient's history has been reviewed, patient examined, no change in status, stable for surgery.  I have reviewed the patient's chart and labs.  Questions were answered to the patient's satisfaction.     Pedro Earls

## 2018-11-09 ENCOUNTER — Inpatient Hospital Stay (HOSPITAL_COMMUNITY): Payer: BLUE CROSS/BLUE SHIELD

## 2018-11-09 ENCOUNTER — Encounter (HOSPITAL_COMMUNITY): Payer: Self-pay | Admitting: Surgery

## 2018-11-09 LAB — COMPREHENSIVE METABOLIC PANEL
ALBUMIN: 2.8 g/dL — AB (ref 3.5–5.0)
ALT: 47 U/L — ABNORMAL HIGH (ref 0–44)
AST: 47 U/L — ABNORMAL HIGH (ref 15–41)
Alkaline Phosphatase: 60 U/L (ref 38–126)
Anion gap: 9 (ref 5–15)
BUN: 16 mg/dL (ref 6–20)
CO2: 25 mmol/L (ref 22–32)
Calcium: 8.3 mg/dL — ABNORMAL LOW (ref 8.9–10.3)
Chloride: 102 mmol/L (ref 98–111)
Creatinine, Ser: 0.68 mg/dL (ref 0.61–1.24)
GFR calc Af Amer: >60 mL/min (ref >60)
GFR calc non Af Amer: >60 mL/min (ref >60)
Glucose, Bld: 175 mg/dL — ABNORMAL HIGH (ref 70–99)
Potassium: 3.9 mmol/L (ref 3.5–5.1)
Sodium: 136 mmol/L (ref 135–145)
Total Bilirubin: 0.8 mg/dL (ref 0.3–1.2)
Total Protein: 6.1 g/dL — ABNORMAL LOW (ref 6.5–8.1)

## 2018-11-09 LAB — CBC WITH DIFFERENTIAL/PLATELET
Abs Immature Granulocytes: 0.16 10*3/uL — ABNORMAL HIGH (ref 0.00–0.07)
Basophils Absolute: 0 10*3/uL (ref 0.0–0.1)
Basophils Relative: 0 %
Eosinophils Absolute: 0 10*3/uL (ref 0.0–0.5)
Eosinophils Relative: 0 %
HCT: 38.8 % — ABNORMAL LOW (ref 39.0–52.0)
HEMOGLOBIN: 12.7 g/dL — AB (ref 13.0–17.0)
Immature Granulocytes: 1 %
LYMPHS PCT: 5 %
Lymphs Abs: 0.5 10*3/uL — ABNORMAL LOW (ref 0.7–4.0)
MCH: 31.1 pg (ref 26.0–34.0)
MCHC: 32.7 g/dL (ref 30.0–36.0)
MCV: 94.9 fL (ref 80.0–100.0)
Monocytes Absolute: 0.6 10*3/uL (ref 0.1–1.0)
Monocytes Relative: 6 %
Neutro Abs: 10.1 10*3/uL — ABNORMAL HIGH (ref 1.7–7.7)
Neutrophils Relative %: 88 %
Platelets: 228 10*3/uL (ref 150–400)
RBC: 4.09 MIL/uL — ABNORMAL LOW (ref 4.22–5.81)
RDW: 13.6 % (ref 11.5–15.5)
WBC: 11.5 10*3/uL — ABNORMAL HIGH (ref 4.0–10.5)
nRBC: 0 % (ref 0.0–0.2)

## 2018-11-09 LAB — MAGNESIUM: Magnesium: 2.3 mg/dL (ref 1.7–2.4)

## 2018-11-09 LAB — LIPASE, BLOOD: Lipase: 42 U/L (ref 11–51)

## 2018-11-09 MED ORDER — IBUPROFEN 400 MG PO TABS: 600.0000 mg | ORAL_TABLET | Freq: Three times a day (TID) | ORAL | Status: DC | PRN

## 2018-11-09 MED ORDER — IBUPROFEN 600 MG PO TABS: 600.0000 mg | ORAL_TABLET | Freq: Three times a day (TID) | ORAL | 0 refills | Status: DC | PRN

## 2018-11-09 MED ORDER — OXYCODONE HCL 5 MG PO TABS: 5.0000 mg | ORAL_TABLET | ORAL | 0 refills | Status: DC | PRN

## 2018-11-09 MED ORDER — OXYCODONE HCL 5 MG PO TABS: 5.0000 mg | ORAL_TABLET | ORAL | Status: DC | PRN

## 2018-11-09 MED ORDER — ACETAMINOPHEN 500 MG PO TABS: 1000.0000 mg | ORAL_TABLET | Freq: Four times a day (QID) | ORAL | 0 refills | Status: DC | PRN

## 2018-11-09 MED ORDER — OXYCODONE-ACETAMINOPHEN 5-325 MG PO TABS
1.0000 | ORAL_TABLET | ORAL | Status: DC | PRN
  Administered 2018-11-09: 1 via ORAL
  Filled 2018-11-09 (×2): qty 1

## 2018-11-09 MED ORDER — ACETAMINOPHEN 500 MG PO TABS: 1000.0000 mg | ORAL_TABLET | Freq: Four times a day (QID) | ORAL | Status: DC | PRN

## 2018-11-09 NOTE — Progress Notes (Addendum)
Patient ID: [name], male   DOB: [birth year], 53 y.o.   MRN: [id number]    1 Day Post-Op  Subjective: Patient doing great.  Minimal pain at all.  Ambulated already this morning.  Tolerating clears well.  Had a BM this morning.  Objective: Vital signs in last 24 hours: Temp:  [97.7 F (36.5 C)-99.2 F (37.3 C)] 97.8 F (36.6 C) (01/15 0556) Pulse Rate:  [73-102] 73 (01/15 0556) Resp:  [14-28] 20 (01/15 0556) BP: (117-160)/(74-87) 129/75 (01/15 0556) SpO2:  [93 %-100 %] 98 % (01/15 0556) Last BM Date: 11/07/18  Intake/Output from previous day: 01/14 0701 - 01/15 0700 In: 3593.7 [P.O.:600; I.V.:2893.7; IV Piggyback:100] Out: 3670 [Urine:3650; Blood:20] Intake/Output this shift: No intake/output data recorded.  PE: Abd: soft, obese, incisions c/d/i with dermadbond, +BS  Lab Results:  Recent Labs    11/08/18 1616 11/09/18 0317  WBC 12.3* 11.5*  HGB 13.2 12.7*  HCT 40.7 38.8*  PLT 250 228   BMET Recent Labs    11/08/18 0306 11/08/18 1616 11/09/18 0317  NA 137  --  136  K 3.1*  --  3.9  CL 103  --  102  CO2 24  --  25  GLUCOSE 117*  --  175*  BUN 19  --  16  CREATININE 0.75 0.79 0.68  CALCIUM 8.2*  --  8.3*   PT/INR No results for input(s): LABPROT, INR in the last 72 hours. CMP     Component Value Date/Time   NA 136 11/09/2018 0317   K 3.9 11/09/2018 0317   CL 102 11/09/2018 0317   CO2 25 11/09/2018 0317   GLUCOSE 175 (H) 11/09/2018 0317   BUN 16 11/09/2018 0317   CREATININE 0.68 11/09/2018 0317   CALCIUM 8.3 (L) 11/09/2018 0317   PROT 6.1 (L) 11/09/2018 0317   ALBUMIN 2.8 (L) 11/09/2018 0317   AST 47 (H) 11/09/2018 0317   ALT 47 (H) 11/09/2018 0317   ALKPHOS 60 11/09/2018 0317   BILITOT 0.8 11/09/2018 0317   GFRNONAA >60 11/09/2018 0317   GFRAA >60 11/09/2018 0317   Lipase     Component Value Date/Time   LIPASE 42 11/09/2018 0317       Studies/Results: Dg Cholangiogram Operative  Result Date: 11/08/2018 CLINICAL DATA:  Gallstones  EXAM: INTRAOPERATIVE CHOLANGIOGRAM TECHNIQUE: Cholangiographic images from the C-arm fluoroscopic device were submitted for interpretation post-operatively. Please see the procedural report for the amount of contrast and the fluoroscopy time utilized. COMPARISON:  None. FINDINGS: Contrast fills the biliary tree and duodenum without filling defects in the common bile duct. IMPRESSION: Patent biliary tree. Electronically Signed   By: Marybelle Killings M.D.   On: 11/08/2018 15:28    Anti-infectives: Anti-infectives (From admission, onward)   Start     Dose/Rate Route Frequency Ordered Stop   11/09/18 0800  cefTRIAXone (ROCEPHIN) 2 g in sodium chloride 0.9 % 100 mL IVPB  Status:  Discontinued     2 g 200 mL/hr over 30 Minutes Intravenous Every 24 hours 11/08/18 1743 11/09/18 0919   11/08/18 0930  cefTRIAXone (ROCEPHIN) 2 g in sodium chloride 0.9 % 100 mL IVPB     2 g 200 mL/hr over 30 Minutes Intravenous On call to O.R. 11/08/18 0927 11/08/18 1420   11/07/18 0600  ceFAZolin (ANCEF) 3 g in dextrose 5 % 50 mL IVPB  Status:  Discontinued     3 g 100 mL/hr over 30 Minutes Intravenous On call to O.R. 11/06/18 1240 11/07/18 8250  11/07/18 0600  metroNIDAZOLE (FLAGYL) IVPB 500 mg  Status:  Discontinued     500 mg 100 mL/hr over 60 Minutes Intravenous On call to O.R. 11/06/18 1240 11/07/18 0958       Assessment/Plan  POD 1, s/p lap chole for gallstone pancreatitis  -patient surgically stable and looks great  -adv to low fat diet, but surgically stable for DC home today  -work note provided and narcotic send to pharmacy already  -DC instruction discussed and provided.  Follow up provided.   FEN - Low fat diet VTE - heparin ID - rocephin pre-op, no further needed   LOS: 6 days    Henreitta Cea , [facility] 11/09/2018, 9:57 AM Pager: [phone]  Agree with above. Wife in room. Ready to go home .  Alphonsa Overall, MD, [facility] Pager:  [phone] Office phone:  [phone]

## 2018-11-09 NOTE — Discharge Instructions (Signed)
[facility] ° °Please arrive at least 30 min before your appointment to complete your check in paperwork.  If you are unable to arrive 30 min prior to your appointment time we may have to cancel or reschedule you. °LAPAROSCOPIC SURGERY: POST OP INSTRUCTIONS °Always review your discharge instruction sheet given to you by the facility where your surgery was performed. °IF YOU HAVE DISABILITY OR FAMILY LEAVE FORMS, YOU MUST BRING THEM TO THE OFFICE FOR PROCESSING.   °DO NOT GIVE THEM TO YOUR DOCTOR. ° °PAIN CONTROL ° °1. First take acetaminophen (Tylenol) AND/or ibuprofen (Advil) to control your pain after surgery.  Follow directions on package.  Taking acetaminophen (Tylenol) and/or ibuprofen (Advil) regularly after surgery will help to control your pain and lower the amount of prescription pain medication you may need.  You should not take more than 4,000 mg (4 grams) of acetaminophen (Tylenol) in 24 hours.  You should not take ibuprofen (Advil), aleve, motrin, naprosyn or other NSAIDS if you have a history of stomach ulcers or chronic kidney disease.  °2. A prescription for pain medication may be given to you upon discharge.  Take your pain medication as prescribed, if you still have uncontrolled pain after taking acetaminophen (Tylenol) or ibuprofen (Advil). °3. Use ice packs to help control pain. °4. If you need a refill on your pain medication, please contact your pharmacy.  They will contact our office to request authorization. Prescriptions will not be filled after 5pm or on week-ends. ° °HOME MEDICATIONS °5. Take your usually prescribed medications unless otherwise directed. ° °DIET °6. You should follow a light diet the first few days after arrival home.  Be sure to include lots of fluids daily. Avoid fatty, fried foods.  ° °CONSTIPATION °7. It is common to experience some constipation after surgery and if you are taking pain medication.  Increasing fluid intake and taking a stool  softener (such as Colace) will usually help or prevent this problem from occurring.  A mild laxative (Milk of Magnesia or Miralax) should be taken according to package instructions if there are no bowel movements after 48 hours. ° °WOUND/INCISION CARE °8. Most patients will experience some swelling and bruising in the area of the incisions.  Ice packs will help.  Swelling and bruising can take several days to resolve.  °9. Unless discharge instructions indicate otherwise, follow guidelines below  °a. STERI-STRIPS - you may remove your outer bandages 48 hours after surgery, and you may shower at that time.  You have steri-strips (small skin tapes) in place directly over the incision.  These strips should be left on the skin for 7-10 days.   °b. DERMABOND/SKIN GLUE - you may shower in 24 hours.  The glue will flake off over the next 2-3 weeks. °10. Any sutures or staples will be removed at the office during your follow-up visit. ° °ACTIVITIES °11. You may resume regular (light) daily activities beginning the next day--such as daily self-care, walking, climbing stairs--gradually increasing activities as tolerated.  You may have sexual intercourse when it is comfortable.  Refrain from any heavy lifting or straining until approved by your doctor. °a. You may drive when you are no longer taking prescription pain medication, you can comfortably wear a seatbelt, and you can safely maneuver your car and apply brakes. ° °FOLLOW-UP °12. You should see your doctor in the office for a follow-up appointment approximately 2-3 weeks after your surgery.  You should have been given your post-op/follow-up appointment when   your surgery was scheduled.  If you did not receive a post-op/follow-up appointment, make sure that you call for this appointment within a day or two after you arrive home to insure a convenient appointment time. ° ° °WHEN TO CALL YOUR DOCTOR: °1. Fever over 101.0 °2. Inability to urinate °3. Continued bleeding from  incision. °4. Increased pain, redness, or drainage from the incision. °5. Increasing abdominal pain ° °The clinic staff is available to answer your questions during regular business hours.  Please don’t hesitate to call and ask to speak to one of the nurses for clinical concerns.  If you have a medical emergency, go to the nearest emergency room or call 911.  A surgeon from [facility] is always on call at the hospital. °[address], [address], [place], [place]  [address] ? [address] [address], [place], [place]   [address] °[phone] ? [phone] ? FAX [phone] ° °

## 2018-11-09 NOTE — Plan of Care (Signed)
Patient discharged home in stable condition 

## 2018-11-09 NOTE — Discharge Summary (Signed)
Physician Discharge Summary  [name] BDZ:[id number] DOB: [birth year] DOA: 11/03/2018  PCP: [name], No Pcp Per  Admit date: 11/03/2018 Discharge date: 11/09/2018  Admitted From: Home  Disposition:  Home   Recommendations for Outpatient Follow-up:  1. Follow up with PCP in 1-2 weeks 2. Please obtain BMP/CBC in one week 3. Follow up with surgery post op.   Home Health: none  Discharge Condition: stable.  CODE STATUS: full code.  Diet recommendation: Heart Healthy  Brief/Interim Summary:   1-gallstone pancreatitis; He was admitted to the hospital, made n.p.o. received IV fluids. Ultrasound showed positive for gallstone. [name] underwent cholecystectomy on 11/08/2018 He is doing well postop, tolerating diet, he had a bowel movement. Cleared for discharge by surgery.  Ileus; KUB obtained on November 06, 2018 which showed dilated gas mid abdomen. Ileus has resolved he has been tolerating diet.  Leukocytosis; reactive.  Afebrile.  Elevated liver function test; related to #1. Needs repeat LFT outpatient.  Bilateral upper extremity edema; This has resolved after IV Lasix. [name] declined Dopplers today.  Edema has resolved okay to discontinue Dopplers.   Discharge Diagnoses:  Active Problems:   Leg cramps   Osteoarthritis   Obesity, Class III, BMI 40-49.9 (morbid obesity) (HCC)   LFT elevation   Acute pancreatitis   Ileus (HCC)   Hypokalemia    Discharge Instructions  Discharge Instructions    Diet - low sodium heart healthy   Complete by:  As directed    Increase activity slowly   Complete by:  As directed      Allergies as of 11/09/2018   No Known Allergies     Medication List    TAKE these medications   acetaminophen 500 MG tablet Commonly known as:  TYLENOL Take 2 tablets (1,000 mg total) by mouth every 6 (six) hours as needed for mild pain.   famotidine 20 MG tablet Commonly known as:  PEPCID Take 20 mg by mouth daily as needed for heartburn or  indigestion.   glucosamine-chondroitin 500-400 MG tablet Take 1 tablet by mouth daily as needed (joint pain).   ibuprofen 600 MG tablet Commonly known as:  ADVIL,MOTRIN Take 1 tablet (600 mg total) by mouth every 8 (eight) hours as needed for headache or mild pain (use Tylenol 1st). What changed:    medication strength  how much to take  when to take this  reasons to take this   magnesium oxide 400 MG tablet Commonly known as:  MAG-OX Take 400 mg by mouth daily.   oxyCODONE 5 MG immediate release tablet Commonly known as:  Oxy IR/ROXICODONE Take 1 tablet (5 mg total) by mouth every 4 (four) hours as needed for moderate pain.   Vitamin D3 250 MCG (10000 UT) Tabs Take 10,000 Units by mouth daily.      Follow-up Information    [facility] [place] Follow up on 11/22/2018.   Specialty:  General Surgery Why:  Your appointment is at10:30 AM. Be at the office 30 minutes early for check in.  Bring photo ID and insurance information.   Contact information: [address] [place] [address] [phone]          No Known Allergies  Consultations:  General surgery   Procedures/Studies: Dg Chest 1 View  Result Date: 11/03/2018 CLINICAL DATA:  Epigastric abdominal pain. EXAM: CHEST  1 VIEW COMPARISON:  None. FINDINGS: The heart size and mediastinal contours are within normal limits. Both lungs are clear. No pneumothorax or pleural effusion is  noted. The visualized skeletal structures are unremarkable. IMPRESSION: No active disease. Electronically Signed   By: Marijo Conception, M.D.   On: 11/03/2018 10:26   Dg Abd 1 View  Result Date: 11/06/2018 CLINICAL DATA:  Abdominal pain. Schedule for gallbladder surgery on 11/07/2018 EXAM: ABDOMEN - 1 VIEW COMPARISON:  CT 11/03/2018 FINDINGS: Gas-filled dilated mid abdominal small bowel with scattered gas and stool within the relatively decompressed colon. Changes suggest small bowel obstruction. No radiopaque stones.  Visualized bones appear intact. IMPRESSION: Dilated gas-filled mid abdominal small bowel suggesting small bowel obstruction. Electronically Signed   By: Lucienne Capers M.D.   On: 11/06/2018 20:57   Dg Cholangiogram Operative  Result Date: 11/08/2018 CLINICAL DATA:  Gallstones EXAM: INTRAOPERATIVE CHOLANGIOGRAM TECHNIQUE: Cholangiographic images from the C-arm fluoroscopic device were submitted for interpretation post-operatively. Please see the procedural report for the amount of contrast and the fluoroscopy time utilized. COMPARISON:  None. FINDINGS: Contrast fills the biliary tree and duodenum without filling defects in the common bile duct. IMPRESSION: Patent biliary tree. Electronically Signed   By: Marybelle Killings M.D.   On: 11/08/2018 15:28   Ct Abdomen Pelvis W Contrast  Result Date: 11/03/2018 CLINICAL DATA:  Abdominal pain and distention today EXAM: CT ABDOMEN AND PELVIS WITH CONTRAST TECHNIQUE: Multidetector CT imaging of the abdomen and pelvis was performed using the standard protocol following bolus administration of intravenous contrast. CONTRAST:  152mL ISOVUE-300 IOPAMIDOL (ISOVUE-300) INJECTION 61% COMPARISON:  None. FINDINGS: Lower chest: The lung bases are clear. The heart is within normal limits in size. No pericardial effusion is seen. There is a moderate amount of epicardial fat present. Hepatobiliary: The liver enhances with no focal abnormality. No ductal dilatation is seen. The gallbladder is well seen with no calcified gallstones noted. Pancreas: There is strandiness around the descending duodenum and head of the pancreas with some fluid layering in the anterior pararenal space extending caudally. These findings are most consistent with pancreatitis although and adjacent descending duodenal duct ulceration can not be excluded. There is some prominence of the common bile duct most likely due to the adjacent inflammatory process. The pancreatic duct is not dilated. Some fluid also is  present layering in the anterior pararenal space on the left. No definite mass is seen. Fluid also extends into the extraperitoneal spaces anteriorly. Spleen: The spleen is unremarkable. Adrenals/Urinary Tract: The adrenal glands appear normal. The kidneys enhance with no calculus or mass and on delayed images, no hydronephrosis is seen. The ureters appear normal in caliber. The urinary bladder is moderately distended with no abnormality noted. Stomach/Bowel: The stomach is largely decompressed with no significant abnormality other than the inflammation surrounding the descending duodenum possibly related to ulceration or adjacent pancreatitis. No small bowel distention or edema is seen. The colon is largely decompressed. The terminal ileum and the appendix are unremarkable. Vascular/Lymphatic: No vascular abnormality is seen. No adenopathy is noted. A 9 mm para-aortic node is present on image 69 series 2. Reproductive: The prostate is normal in size. Other: There is a small umbilical hernia containing only fat. Musculoskeletal: The lumbar vertebrae are in normal alignment with normal intervertebral disc spaces. The SI joints appear well corticated. IMPRESSION: 1. Inflammatory process in the region of the head of the pancreas and descending duodenum most consistent with acute pancreatitis with pancreatic exudate. A descending duodenal ulceration would be difficult to exclude. However pancreatitis is favored as the etiology. 2. The appendix and terminal ileum are unremarkable. 3. Small umbilical hernia containing only fat. Electronically  Signed   By: Ivar Drape M.D.   On: 11/03/2018 11:04   US Abdomen Limited Ruq  Result Date: 11/03/2018 CLINICAL DATA:  Pancreatic inflammation/pancreatitis on CT performed earlier today. EXAM: ULTRASOUND ABDOMEN LIMITED RIGHT UPPER QUADRANT COMPARISON:  CT, 11/03/2018 FINDINGS: Gallbladder: Small dependent mobile gallstones. No gallbladder wall thickening or pericholecystic fluid.  Common bile duct: Diameter: 5 mm Liver: Increased parenchymal echogenicity. No mass or focal lesion. Portal vein is patent on color Doppler imaging with normal direction of blood flow towards the liver. IMPRESSION: 1. Cholelithiasis without evidence of acute cholecystitis. No bile duct dilation. 2. Hepatic steatosis. Electronically Signed   By: Lajean Manes M.D.   On: 11/03/2018 15:27      Subjective: He is in bed, he is feeling good he denies shortness of breath.  He reports that upper extremity edema has resolved  Discharge Exam: Vitals:   11/09/18 0556 11/09/18 1039  BP: 129/75 127/71  Pulse: 73 81  Resp: 20 16  Temp: 97.8 F (36.6 C) 98.1 F (36.7 C)  SpO2: 98% 99%   Vitals:   11/08/18 2119 11/09/18 0149 11/09/18 0556 11/09/18 1039  BP: 134/76 117/75 129/75 127/71  Pulse: 92 77 73 81  Resp: 20 14 20 16   Temp: 97.8 F (36.6 C) 98 F (36.7 C) 97.8 F (36.6 C) 98.1 F (36.7 C)  TempSrc: Oral Oral  Oral  SpO2: 95% 97% 98% 99%  Weight:      Height:        General: Pt is alert, awake, not in acute distress Cardiovascular: RRR, S1/S2 +, no rubs, no gallops Respiratory: CTA bilaterally, no wheezing, no rhonchi Abdominal: Soft, NT, ND, bowel sounds +, incisions healing Extremities: no edema, no cyanosis    The results of significant diagnostics from this hospitalization (including imaging, microbiology, ancillary and laboratory) are listed below for reference.     Microbiology: Recent Results (from the past 240 hour(s))  Surgical PCR screen     Status: None   Collection Time: 11/07/18  9:45 AM  Result Value Ref Range Status   MRSA, PCR NEGATIVE NEGATIVE Final   Staphylococcus aureus NEGATIVE NEGATIVE Final    Comment: (NOTE) The Xpert SA Assay (FDA approved for NASAL specimens in patients 24 years of age and older), is one component of a comprehensive surveillance program. It is not intended to diagnose infection nor to guide or monitor treatment. Performed at  [facility], [place] [address], [place], [place] [address]      Labs: BNP (last 3 results) No results for input(s): BNP in the last 8760 hours. Basic Metabolic Panel: Recent Labs  Lab 11/04/18 0523 11/05/18 0423 11/06/18 0316 11/07/18 0357 11/08/18 0306 11/08/18 1616 11/09/18 0317  NA 138 135 135 138 137  --  136  K 4.0 3.7 3.4* 3.7 3.1*  --  3.9  CL 107 106 105 103 103  --  102  CO2 25 20* 23 25 24   --  25  GLUCOSE 143* 119* 125* 132* 117*  --  175*  BUN 18 16 17 19 19   --  16  CREATININE 0.80 0.70 0.70 0.85 0.75 0.79 0.68  CALCIUM 8.5* 8.0* 8.1* 8.2* 8.2*  --  8.3*  MG 2.1  --  2.2  --  2.1  --  2.3   Liver Function Tests: Recent Labs  Lab 11/05/18 0423 11/06/18 0316 11/07/18 0357 11/08/18 0306 11/09/18 0317  AST 18 15 18 15  47*  ALT 44 30 25 22  47*  ALKPHOS 56 56 57 69 60  BILITOT 1.2 1.5* 1.6* 1.8* 0.8  PROT 6.1* 6.0* 6.1* 6.0* 6.1*  ALBUMIN 3.3* 3.2* 2.9* 2.9* 2.8*   Recent Labs  Lab 11/06/18 0316 11/07/18 0357 11/08/18 0306 11/08/18 1743 11/09/18 0317  LIPASE 73* 40 42 42 42   No results for input(s): AMMONIA in the last 168 hours. CBC: Recent Labs  Lab 11/06/18 0316 11/07/18 0357 11/08/18 0306 11/08/18 1616 11/09/18 0317  WBC 14.6* 13.3* 11.8* 12.3* 11.5*  NEUTROABS 12.4* 11.0* 9.0* 10.4* 10.1*  HGB 13.4 14.3 13.0 13.2 12.7*  HCT 41.6 44.4 40.6 40.7 38.8*  MCV 96.5 94.1 93.3 94.0 94.9  PLT 189 244 243 250 228   Cardiac Enzymes: No results for input(s): CKTOTAL, CKMB, CKMBINDEX, TROPONINI in the last 168 hours. BNP: Invalid input(s): POCBNP CBG: No results for input(s): GLUCAP in the last 168 hours. D-Dimer No results for input(s): DDIMER in the last 72 hours. Hgb A1c No results for input(s): HGBA1C in the last 72 hours. Lipid Profile No results for input(s): CHOL, HDL, LDLCALC, TRIG, CHOLHDL, LDLDIRECT in the last 72 hours. Thyroid function studies No results for input(s): TSH, T4TOTAL, T3FREE, THYROIDAB in the  last 72 hours.  Invalid input(s): FREET3 Anemia work up No results for input(s): VITAMINB12, FOLATE, FERRITIN, TIBC, IRON, RETICCTPCT in the last 72 hours. Urinalysis    Component Value Date/Time   COLORURINE YELLOW 11/03/2018 1400   APPEARANCEUR CLEAR 11/03/2018 1400   LABSPEC 1.023 11/03/2018 1400   PHURINE 5.0 11/03/2018 1400   GLUCOSEU NEGATIVE 11/03/2018 1400   HGBUR NEGATIVE 11/03/2018 1400   BILIRUBINUR NEGATIVE 11/03/2018 1400   KETONESUR NEGATIVE 11/03/2018 1400   PROTEINUR NEGATIVE 11/03/2018 1400   NITRITE NEGATIVE 11/03/2018 1400   LEUKOCYTESUR NEGATIVE 11/03/2018 1400   Sepsis Labs Invalid input(s): PROCALCITONIN,  WBC,  LACTICIDVEN Microbiology Recent Results (from the past 240 hour(s))  Surgical PCR screen     Status: None   Collection Time: 11/07/18  9:45 AM  Result Value Ref Range Status   MRSA, PCR NEGATIVE NEGATIVE Final   Staphylococcus aureus NEGATIVE NEGATIVE Final    Comment: (NOTE) The Xpert SA Assay (FDA approved for NASAL specimens in patients 72 years of age and older), is one component of a comprehensive surveillance program. It is not intended to diagnose infection nor to guide or monitor treatment. Performed at [facility], [place] [address] [place], [place] [address]      Time coordinating discharge:  35 minutes  SIGNED:   Elmarie Shiley, MD  [facility] 11/09/2018, 2:21 PM Pager   If 7PM-7AM, please contact night-coverage [email or web address] Password [email or web address]

## 2018-11-22 ENCOUNTER — Other Ambulatory Visit: Payer: Self-pay | Admitting: Student

## 2018-11-22 DIAGNOSIS — K219 Gastro-esophageal reflux disease without esophagitis: Secondary | ICD-10-CM

## 2018-11-22 DIAGNOSIS — R1310 Dysphagia, unspecified: Secondary | ICD-10-CM

## 2018-11-23 ENCOUNTER — Other Ambulatory Visit: Payer: Self-pay | Admitting: Student

## 2018-11-23 ENCOUNTER — Ambulatory Visit (HOSPITAL_COMMUNITY)
Admission: RE | Admit: 2018-11-23 | Discharge: 2018-11-23 | Disposition: A | Discharge status: Home / Self Care | Payer: BLUE CROSS/BLUE SHIELD | Source: Ambulatory Visit | Attending: Student | Admitting: Student

## 2018-11-23 DIAGNOSIS — R1310 Dysphagia, unspecified: Secondary | ICD-10-CM | POA: Diagnosis present

## 2019-03-03 ENCOUNTER — Other Ambulatory Visit: Payer: Self-pay | Admitting: Gastroenterology

## 2019-03-21 ENCOUNTER — Other Ambulatory Visit (HOSPITAL_COMMUNITY)
Admission: RE | Admit: 2019-03-21 | Discharge: 2019-03-21 | Disposition: A | Discharge status: Home / Self Care | Payer: BLUE CROSS/BLUE SHIELD | Source: Ambulatory Visit | Attending: Gastroenterology | Admitting: Gastroenterology

## 2019-03-21 DIAGNOSIS — Z1159 Encounter for screening for other viral diseases: Secondary | ICD-10-CM | POA: Diagnosis present

## 2019-03-22 LAB — NOVEL CORONAVIRUS, NAA (HOSP ORDER, SEND-OUT TO REF LAB; TAT 18-24 HRS): SARS-CoV-2, NAA: NOT DETECTED

## 2019-03-23 ENCOUNTER — Encounter (HOSPITAL_COMMUNITY): Payer: Self-pay | Admitting: *Deleted

## 2019-03-23 ENCOUNTER — Other Ambulatory Visit: Payer: Self-pay

## 2019-03-23 NOTE — Progress Notes (Signed)
Attempted to contact patient for preop screening. No answer.

## 2019-03-23 NOTE — Progress Notes (Addendum)
LM on VM for patient to call back to go over information in Epic for Anesthesia for procedure on 03/24/2019. LM on VM for patient to not take any medications tomorrow morning, nothing to drink  Or eat after midnight  and to arrive at Admitting at 0730 am with picture ID and Insurance card. Follow bowel prep instructions from your doctor. Bring cell phone in and phone number of person taking patient home.

## 2019-03-24 ENCOUNTER — Encounter (HOSPITAL_COMMUNITY)
Admission: RE | Disposition: A | Discharge status: Home / Self Care | Payer: Self-pay | Source: Home / Self Care | Attending: Gastroenterology

## 2019-03-24 ENCOUNTER — Encounter (HOSPITAL_COMMUNITY): Payer: Self-pay | Admitting: *Deleted

## 2019-03-24 ENCOUNTER — Ambulatory Visit (HOSPITAL_COMMUNITY): Payer: BLUE CROSS/BLUE SHIELD | Admitting: Anesthesiology

## 2019-03-24 ENCOUNTER — Ambulatory Visit (HOSPITAL_COMMUNITY)
Admission: RE | Admit: 2019-03-24 | Discharge: 2019-03-24 | Disposition: A | Discharge status: Home / Self Care | Payer: BLUE CROSS/BLUE SHIELD | Attending: Gastroenterology | Admitting: Gastroenterology

## 2019-03-24 DIAGNOSIS — K635 Polyp of colon: Secondary | ICD-10-CM | POA: Insufficient documentation

## 2019-03-24 DIAGNOSIS — D122 Benign neoplasm of ascending colon: Secondary | ICD-10-CM | POA: Diagnosis not present

## 2019-03-24 DIAGNOSIS — Z87891 Personal history of nicotine dependence: Secondary | ICD-10-CM | POA: Diagnosis not present

## 2019-03-24 DIAGNOSIS — Z6841 Body Mass Index (BMI) 40.0 and over, adult: Secondary | ICD-10-CM | POA: Diagnosis not present

## 2019-03-24 DIAGNOSIS — Z1211 Encounter for screening for malignant neoplasm of colon: Secondary | ICD-10-CM | POA: Insufficient documentation

## 2019-03-24 HISTORY — PX: COLONOSCOPY WITH PROPOFOL: SHX5780

## 2019-03-24 HISTORY — PX: POLYPECTOMY: SHX5525

## 2019-03-24 SURGERY — COLONOSCOPY WITH PROPOFOL
Anesthesia: Monitor Anesthesia Care

## 2019-03-24 MED ORDER — PROPOFOL 10 MG/ML IV BOLUS
INTRAVENOUS | Status: AC
  Filled 2019-03-24: qty 40

## 2019-03-24 MED ORDER — LACTATED RINGERS IV SOLN
INTRAVENOUS | Status: DC
  Administered 2019-03-24: 1000 mL via INTRAVENOUS

## 2019-03-24 MED ORDER — PROPOFOL 500 MG/50ML IV EMUL
INTRAVENOUS | Status: DC | PRN
  Administered 2019-03-24: 125 ug/kg/min via INTRAVENOUS

## 2019-03-24 SURGICAL SUPPLY — 21 items

## 2019-03-24 NOTE — Anesthesia Postprocedure Evaluation (Signed)
Anesthesia Post Note  Patient: [name]  Procedure(s) Performed: COLONOSCOPY WITH PROPOFOL (N/A ) POLYPECTOMY     Patient location during evaluation: PACU Anesthesia Type: MAC Level of consciousness: awake and alert Pain management: pain level controlled Vital Signs Assessment: post-procedure vital signs reviewed and stable Respiratory status: spontaneous breathing, nonlabored ventilation, respiratory function stable and patient connected to nasal cannula oxygen Cardiovascular status: stable and blood pressure returned to baseline Postop Assessment: no apparent nausea or vomiting Anesthetic complications: no    Last Vitals:  Vitals:   03/24/19 0920 03/24/19 0930  BP: 128/78 125/73  Pulse: 72 67  Resp: 12 20  Temp: (!) 36.4 C   SpO2: 95% 96%    Last Pain:  Vitals:   03/24/19 0930  TempSrc:   PainSc: 0-No pain                 Mylan Lengyel S

## 2019-03-24 NOTE — H&P (Signed)
  Azhar Helene Kelp HPI: At this time the patient denies any problems with nausea, vomiting, fevers, chills, abdominal pain, diarrhea, constipation, hematochezia, melena, GERD, or dysphagia. The patient denies any known family history of colon cancers. No complaints of chest pain, SOB, MI, or sleep apnea. In January this year he woke up with a "heartburn" sensation and he presented to the ER. He was found to have an acute cholecystitis and he underwent a successful lap chole.   History reviewed. No pertinent past medical history.  Past Surgical History:  Procedure Laterality Date  . CHOLECYSTECTOMY N/A 11/08/2018   Procedure: LAPAROSCOPIC CHOLECYSTECTOMY WITH INTRAOPERATIVE CHOLANGIOGRAM;  Surgeon: Johnathan Hausen, MD;  Location: [facility]S;  Service: General;  Laterality: N/A;  . ROTATOR CUFF REPAIR Left 1990    History reviewed. No pertinent family history.  Social History:  reports that he has quit smoking. He has never used smokeless tobacco. He reports previous alcohol use. He reports previous drug use.  Allergies: No Known Allergies  Medications:  Scheduled:  Continuous: . lactated ringers 1,000 mL (03/24/19 0755)    No results found for this or any previous visit (from the past 24 hour(s)).   No results found.  ROS:  As stated above in the HPI otherwise negative.  Blood pressure (!) 159/88, pulse 67, temperature 98.8 F (37.1 C), temperature source Oral, resp. rate 16, height 6\' 3"  (1.905 m), weight (!) 170.1 kg, SpO2 98 %.    PE: Gen: NAD, Alert and Oriented HEENT:  [place] M. [place]/AT, EOMI, thick neck Neck: Supple, no LAD Lungs: CTA Bilaterally CV: RRR without M/G/R ABM: Soft, NTND, +BS, morbidly obese Ext: No C/C/E  Assessment/Plan: 1) Screening colonoscopy  Raine Blodgett D 03/24/2019, 8:23 AM

## 2019-03-24 NOTE — Transfer of Care (Signed)
Immediate Anesthesia Transfer of Care Note  Patient: [name]  Procedure(s) Performed: COLONOSCOPY WITH PROPOFOL (N/A ) POLYPECTOMY  Patient Location: PACU  Anesthesia Type:MAC  Level of Consciousness: sedated, patient cooperative and responds to stimulation  Airway & Oxygen Therapy: Patient Spontanous Breathing and Patient connected to face mask oxygen  Post-op Assessment: Report given to RN and Post -op Vital signs reviewed and stable  Post vital signs: Reviewed and stable  Last Vitals:  Vitals Value Taken Time  BP 128/78 03/24/2019  9:20 AM  Temp    Pulse 66 03/24/2019  9:20 AM  Resp 12 03/24/2019  9:21 AM  SpO2 95 % 03/24/2019  9:20 AM  Vitals shown include unvalidated device data.  Last Pain:  Vitals:   03/24/19 0747  TempSrc: Oral  PainSc: 0-No pain         Complications: No apparent anesthesia complications

## 2019-03-24 NOTE — Discharge Instructions (Signed)

## 2019-03-24 NOTE — Op Note (Signed)
[facility] Patient Name: [name] Procedure Date: 03/24/2019 MRN: [id number] Attending MD: Carol Ada , MD Date of Birth: [birth year] CSN: [id number] Age: 53 Admit Type: Inpatient Procedure:                Colonoscopy Indications:              Screening for colorectal malignant neoplasm Providers:                Carol Ada, MD, Cleda Daub, RN, Ladona Ridgel, Technician Referring MD:              Medicines:                Propofol per Anesthesia Complications:            No immediate complications. Estimated Blood Loss:     Estimated blood loss: none. Procedure:                Pre-Anesthesia Assessment:                           - Prior to the procedure, a History and Physical                            was performed, and patient medications and                            allergies were reviewed. The patient's tolerance of                            previous anesthesia was also reviewed. The risks                            and benefits of the procedure and the sedation                            options and risks were discussed with the patient.                            All questions were answered, and informed consent                            was obtained. Prior Anticoagulants: The patient has                            taken no previous anticoagulant or antiplatelet                            agents. ASA Grade Assessment: III - A patient with                            severe systemic disease. After reviewing the risks  and benefits, the patient was deemed in                            satisfactory condition to undergo the procedure.                           - Sedation was administered by an anesthesia                            professional. Deep sedation was attained.                           After obtaining informed consent, the colonoscope                            was passed under direct  vision. Throughout the                            procedure, the patient's blood pressure, pulse, and                            oxygen saturations were monitored continuously. The                            CF-HQ190L ([id number]) Olympus colonoscope was                            introduced through the anus and advanced to the the                            cecum, identified by appendiceal orifice and                            ileocecal valve. The colonoscopy was performed                            without difficulty. The patient tolerated the                            procedure well. The quality of the bowel                            preparation was excellent. The ileocecal valve,                            appendiceal orifice, and rectum were photographed. Scope In: 8:51:45 AM Scope Out: 9:13:47 AM Scope Withdrawal Time: 0 hours 19 minutes 12 seconds  Total Procedure Duration: 0 hours 22 minutes 2 seconds  Findings:      Four sessile polyps were found in the transverse colon and ascending       colon. The polyps were 3 to 4 mm in size. These polyps were removed with       a cold snare. Resection and retrieval were complete. Impression:               -  Four 3 to 4 mm polyps in the transverse colon and                            in the ascending colon, removed with a cold snare.                            Resected and retrieved. Moderate Sedation:      Not Applicable - Patient had care per Anesthesia. Recommendation:           - Patient has a contact number available for                            emergencies. The signs and symptoms of potential                            delayed complications were discussed with the                            patient. Return to normal activities tomorrow.                            Written discharge instructions were provided to the                            patient.                           - Resume previous diet.                           -  Continue present medications.                           - Await pathology results.                           - Repeat colonoscopy in 3 - 5 years for                            surveillance. Procedure Code(s):        --- Professional ---                           [phone], Colonoscopy, flexible; with removal of                            tumor(s), polyp(s), or other lesion(s) by snare                            technique Diagnosis Code(s):        --- Professional ---                           Z12.11, Encounter for screening for malignant  neoplasm of colon                           K63.5, Polyp of colon CPT copyright 2019 American Medical Association. All rights reserved. The codes documented in this report are preliminary and upon coder review may  be revised to meet current compliance requirements. Carol Ada, MD Carol Ada, MD 03/24/2019 9:17:46 AM This report has been signed electronically. Number of Addenda: 0

## 2019-03-24 NOTE — Anesthesia Preprocedure Evaluation (Signed)
Anesthesia Evaluation  Patient identified by MRN, date of birth, ID band Patient awake    Reviewed: Allergy & Precautions, NPO status , Patient's Chart, lab work & pertinent test results  Airway Mallampati: III  TM Distance: <3 FB Neck ROM: Full    Dental no notable dental hx.    Pulmonary neg pulmonary ROS, former smoker,    breath sounds clear to auscultation + decreased breath sounds      Cardiovascular negative cardio ROS Normal cardiovascular exam Rhythm:Regular Rate:Normal     Neuro/Psych negative neurological ROS  negative psych ROS   GI/Hepatic negative GI ROS, Neg liver ROS,   Endo/Other  Morbid obesity  Renal/GU negative Renal ROS  negative genitourinary   Musculoskeletal negative musculoskeletal ROS (+)   Abdominal (+) + obese,   Peds negative pediatric ROS (+)  Hematology negative hematology ROS (+)   Anesthesia Other Findings   Reproductive/Obstetrics negative OB ROS                             Anesthesia Physical Anesthesia Plan  ASA: III  Anesthesia Plan: MAC   Post-op Pain Management:    Induction: Intravenous  PONV Risk Score and Plan: 0  Airway Management Planned: Simple Face Mask  Additional Equipment:   Intra-op Plan:   Post-operative Plan:   Informed Consent: I have reviewed the patients History and Physical, chart, labs and discussed the procedure including the risks, benefits and alternatives for the proposed anesthesia with the patient or authorized representative who has indicated his/her understanding and acceptance.     Dental advisory given  Plan Discussed with: CRNA and Surgeon  Anesthesia Plan Comments:         Anesthesia Quick Evaluation

## 2019-03-27 ENCOUNTER — Encounter (HOSPITAL_COMMUNITY): Payer: Self-pay | Admitting: Gastroenterology

## 2019-04-13 ENCOUNTER — Encounter: Payer: BLUE CROSS/BLUE SHIELD | Admitting: Diagnostic Neuroimaging

## 2019-06-12 ENCOUNTER — Encounter (HOSPITAL_COMMUNITY): Payer: BLUE CROSS/BLUE SHIELD

## 2019-06-26 ENCOUNTER — Other Ambulatory Visit: Payer: Self-pay

## 2019-06-26 DIAGNOSIS — I83899 Varicose veins of unspecified lower extremities with other complications: Secondary | ICD-10-CM

## 2019-06-29 ENCOUNTER — Ambulatory Visit (INDEPENDENT_AMBULATORY_CARE_PROVIDER_SITE_OTHER): Payer: BC Managed Care – PPO | Admitting: Vascular Surgery

## 2019-06-29 ENCOUNTER — Ambulatory Visit (HOSPITAL_COMMUNITY)
Admission: RE | Admit: 2019-06-29 | Discharge: 2019-06-29 | Disposition: A | Discharge status: Home / Self Care | Payer: BC Managed Care – PPO | Source: Ambulatory Visit | Attending: Vascular Surgery | Admitting: Vascular Surgery

## 2019-06-29 ENCOUNTER — Other Ambulatory Visit: Payer: Self-pay

## 2019-06-29 ENCOUNTER — Encounter: Payer: Self-pay | Admitting: Vascular Surgery

## 2019-06-29 VITALS — BP 123/79 | HR 78 | Temp 98.5°F | Resp 18 | Ht 74.5 in | Wt >= 6400 oz

## 2019-06-29 DIAGNOSIS — I872 Venous insufficiency (chronic) (peripheral): Secondary | ICD-10-CM

## 2019-06-29 DIAGNOSIS — I83899 Varicose veins of unspecified lower extremities with other complications: Secondary | ICD-10-CM

## 2019-06-29 NOTE — Progress Notes (Signed)
REASON FOR CONSULT:    Varicose veins.  The consult is requested by Dr. Horald Pollen.  ASSESSMENT & PLAN:   CHRONIC VENOUS INSUFFICIENCY: This patient does have evidence of significant chronic venous insufficiency in the left leg.  He has deep venous reflux in the popliteal vein and superficial venous reflux in the left great saphenous vein.  We have discussed the importance of intermittent leg elevation and the proper positioning for this.  In addition I have given him a prescription for knee-high compression stockings with a gradient of 15 to 20 mmHg.  I have encouraged him to avoid prolonged sitting and standing.  We discussed the importance of exercise specifically walking and water aerobics.  In addition we discussed the importance of weight management given that central obesity especially increases lower extremity venous pressure.  If his symptoms progress and swelling progresses then I think we should try him in thigh-high compression stockings with a gradient of 20 to 30 mmHg.  If he continues to have symptoms I think we could see him back in 3 months and evaluate him for potential laser ablation of the left great saphenous vein.  Based on monitor exam of the vein I think he would be a good candidate for that if conservative measures fail.  He will call if his symptoms progress.   Deitra Mayo, MD, FACS Beeper [phone] Office: [phone]   HPI:   [name] is a pleasant 53 y.o. male, who presents with the gradual onset of bilateral lower extremity swelling which began about 18 months ago.  His swelling is slightly worse on the right he believes.  He describes some achiness heaviness and fatigue in both legs which is aggravated by sitting and standing and relieved with elevation.  He is also had some burning and itching in both legs.  He works at [employer] and is on his feet for 8 hours at a time.  He does elevate his leg some which helps his symptoms.  He has been wearing  some knee-high compression stockings which also helped although they do not fit well around the knee.  He does occasionally take ibuprofen as needed for pain.  He denies any previous history of DVT or phlebitis.  History reviewed. No pertinent past medical history.  History reviewed. No pertinent family history.  SOCIAL HISTORY: Social History   Socioeconomic History  . Marital status: Married    Spouse name: Not on file  . Number of children: Not on file  . Years of education: Not on file  . Highest education level: Not on file  Occupational History  . Not on file  Social Needs  . Financial resource strain: Not on file  . Food insecurity    Worry: Not on file    Inability: Not on file  . Transportation needs    Medical: Not on file    Non-medical: Not on file  Tobacco Use  . Smoking status: Former Research scientist (life sciences)  . Smokeless tobacco: Never Used  Substance and Sexual Activity  . Alcohol use: Not Currently  . Drug use: Not Currently  . Sexual activity: Not on file  Lifestyle  . Physical activity    Days per week: Not on file    Minutes per session: Not on file  . Stress: Not on file  Relationships  . Social Herbalist on phone: Not on file    Gets together: Not on file    Attends religious service: Not on file  Active member of club or organization: Not on file    Attends meetings of clubs or organizations: Not on file    Relationship status: Not on file  . Intimate partner violence    Fear of current or ex partner: Not on file    Emotionally abused: Not on file    Physically abused: Not on file    Forced sexual activity: Not on file  Other Topics Concern  . Not on file  Social History Narrative  . Not on file    No Known Allergies  Current Outpatient Medications  Medication Sig Dispense Refill  . b complex vitamins tablet Take 1 tablet by mouth daily.    . Betamethasone Dipropionate (SERNIVO) 0.05 % EMUL Apply 1 application topically daily as needed  (rash).    . Cholecalciferol (VITAMIN D3) 250 MCG (10000 UT) TABS Take 10,000 Units by mouth daily.    . mometasone (NASONEX) 50 MCG/ACT nasal spray Place 1 spray into the nose daily as needed (allergies).    . gabapentin (NEURONTIN) 300 MG capsule Take 300 mg by mouth at bedtime.     No current facility-administered medications for this visit.     REVIEW OF SYSTEMS:  [X]  denotes positive finding, [ ]  denotes negative finding Cardiac  Comments:  Chest pain or chest pressure:    Shortness of breath upon exertion:    Short of breath when lying flat:    Irregular heart rhythm:        Vascular    Pain in calf, thigh, or hip brought on by ambulation:    Pain in feet at night that wakes you up from your sleep:     Blood clot in your veins:    Leg swelling:  x       Pulmonary    Oxygen at home:    Productive cough:     Wheezing:         Neurologic    Sudden weakness in arms or legs:     Sudden numbness in arms or legs:     Sudden onset of difficulty speaking or slurred speech:    Temporary loss of vision in one eye:     Problems with dizziness:         Gastrointestinal    Blood in stool:     Vomited blood:         Genitourinary    Burning when urinating:     Blood in urine:        Psychiatric    Major depression:         Hematologic    Bleeding problems:    Problems with blood clotting too easily:        Skin    Rashes or ulcers:        Constitutional    Fever or chills:     PHYSICAL EXAM:   Vitals:   06/29/19 1518  BP: 123/79  Pulse: 78  Resp: 18  Temp: 98.5 F (36.9 C)  TempSrc: Temporal  SpO2: 98%  Weight: (!) 402 lb 6.4 oz (182.5 kg)  Height: 6' 2.5" (1.892 m)   Body mass index is 50.97 kg/m.   GENERAL: The patient is a well-nourished male, in no acute distress. The vital signs are documented above. CARDIAC: There is a regular rate and rhythm.  VASCULAR: I do not detect carotid bruits. He has palpable pedal pulses bilaterally. He has bilateral  lower extremity swelling.  He has some hyperpigmentation bilaterally.  He has some small spider veins around the ankles bilaterally. I did look at his left great saphenous vein myself with the SonoSite.  He has reflux from the saphenofemoral junction to the knee.  I think the vein could be accessed just above the knee. RIGHT LEG:   LEFT LEG:     PULMONARY: There is good air exchange bilaterally without wheezing or rales. ABDOMEN: Soft and non-tender with normal pitched bowel sounds.  MUSCULOSKELETAL: There are no major deformities or cyanosis. NEUROLOGIC: No focal weakness or paresthesias are detected. SKIN: There are no ulcers or rashes noted. PSYCHIATRIC: The patient has a normal affect.  DATA:    VENOUS DUPLEX: I have independently interpreted his venous duplex scan today.  On the right side there is no evidence of DVT.  There is no deep venous reflux.  There is superficial venous reflux at the saphenofemoral junction only.  On the left side there is no evidence of DVT.  There is deep venous reflux involving the popliteal vein.  There is superficial venous reflux involving the great saphenous vein which has reflux from the groin to the knee.  The vein is dilated up to 0.78 cm.

## 2019-08-24 IMAGING — RF DG ESOPHAGUS
7 of 8 series · 14 of 23 positions shown · non-contrast
Comparison: None.

CLINICAL DATA: Dysphagia

EXAM:
ESOPHOGRAM / BARIUM SWALLOW / BARIUM TABLET STUDY
TECHNIQUE: Combined double contrast and single contrast examination performed
using effervescent crystals, thick barium liquid, and thin barium
liquid. The patient was observed with fluoroscopy swallowing a 13 mm
barium sulphate tablet.
FLUOROSCOPY TIME:  Fluoroscopy Time:    1 minutes 24 seconds
Radiation Exposure Index (if provided by the fluoroscopic device):
Number of Acquired Spot Images: 0

[Series 1: cp_standard · 0.34mm/px · 2 of 56 frames shown (1 of 7)]
[frame 9/56]
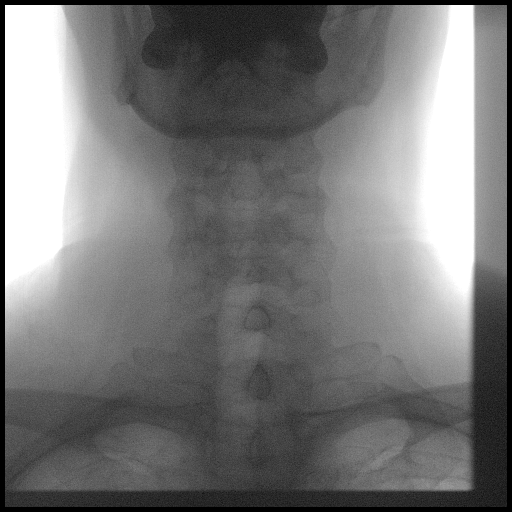
[frame 48/56]
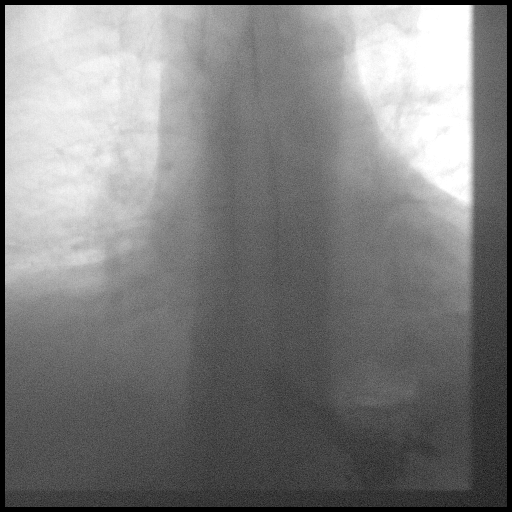

[Series 2: cp_standard · 0.34mm/px · 3 of 47 frames shown (2 of 7)]
[frame 2/47]
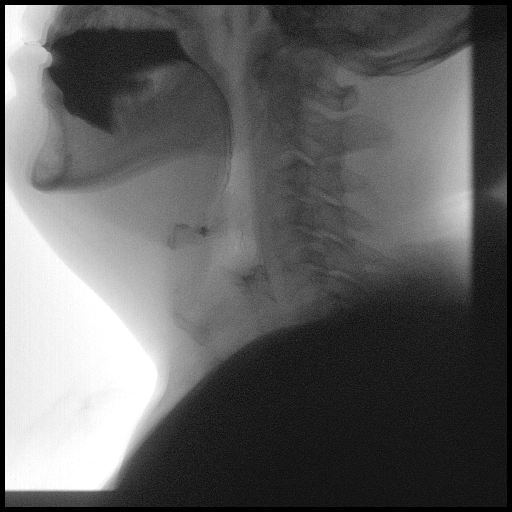
[frame 8/47]
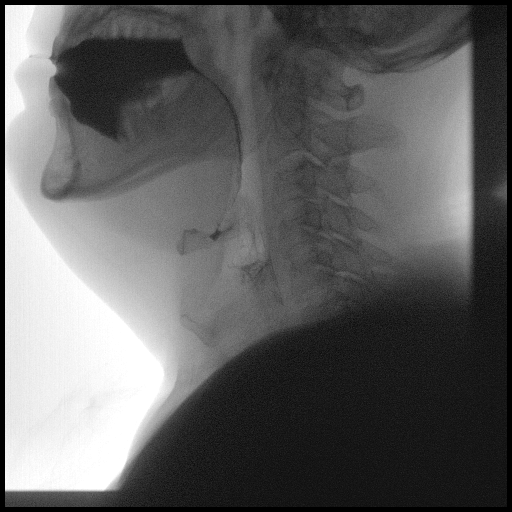
[frame 40/47]
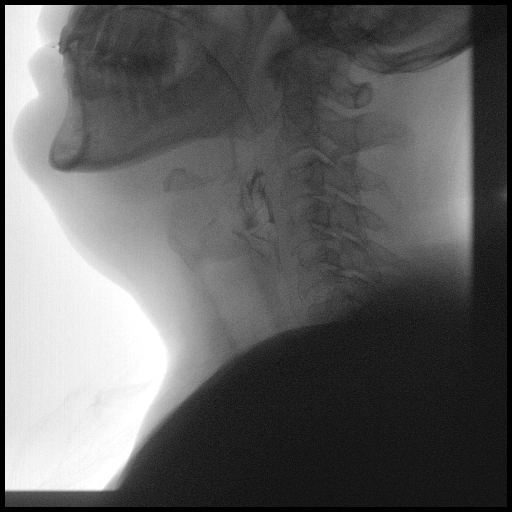

[Series 3: cp_standard · 0.34mm/px · 2 of 87 frames shown (3 of 7)]
[frame 20/87]
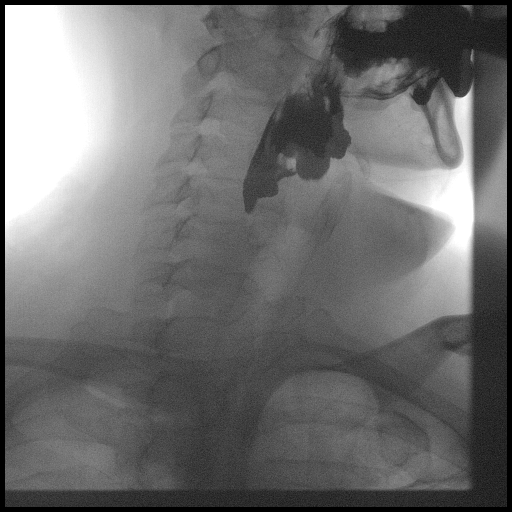
[frame 44/87]
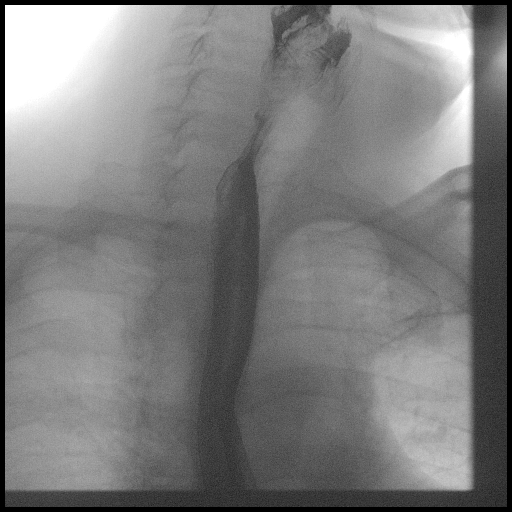

[Series 4: cp_standard · 0.34mm/px · 3 of 25 frames shown (4 of 7)]
[frame 4/25]
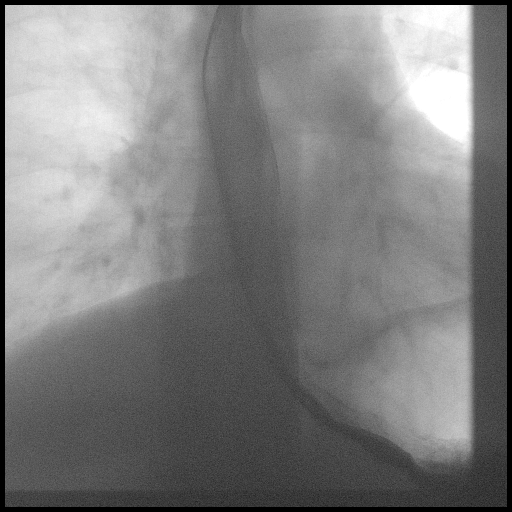
[frame 12/25]
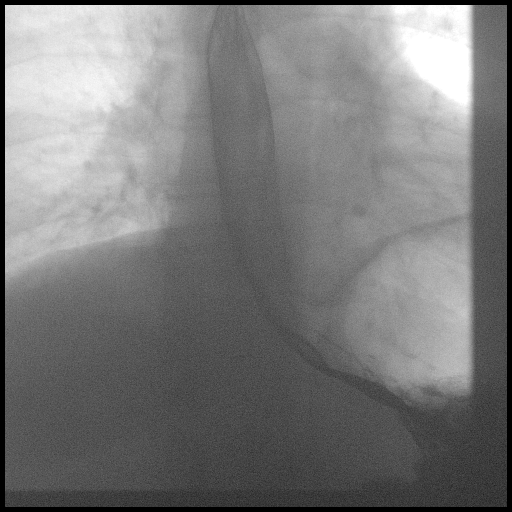
[frame 22/25]
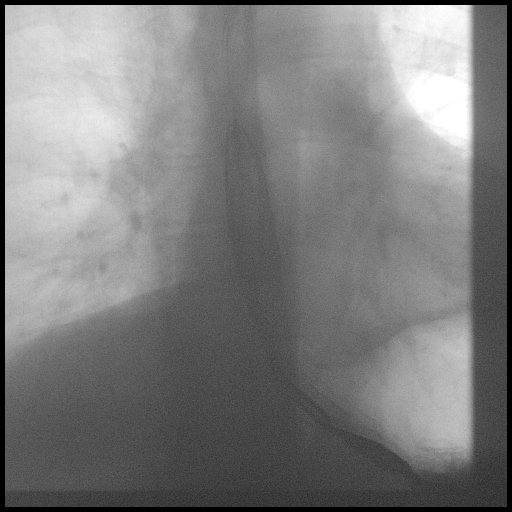

[Series 5: cp_standard · 0.35mm/px · 2 of 103 frames shown (5 of 7)]
[frame 52/103]
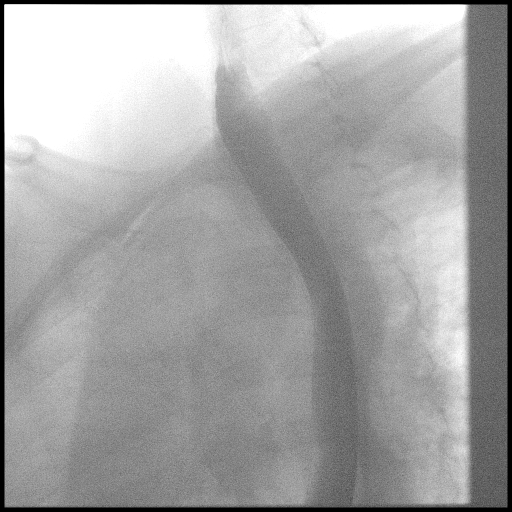
[frame 88/103]
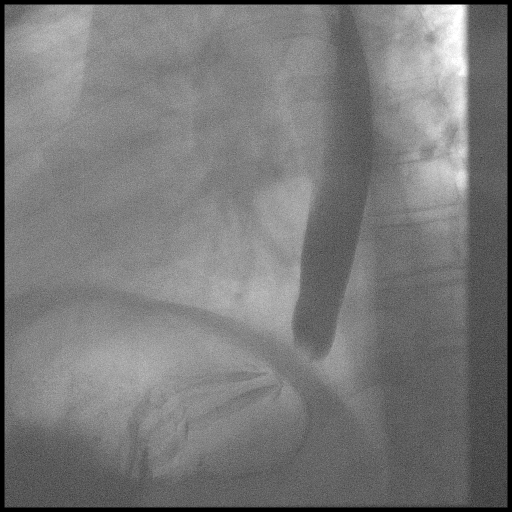

[Series 6: cp_standard · 0.17mm/px · 1 of 1 slices shown (6 of 7)]
[im 1/1]
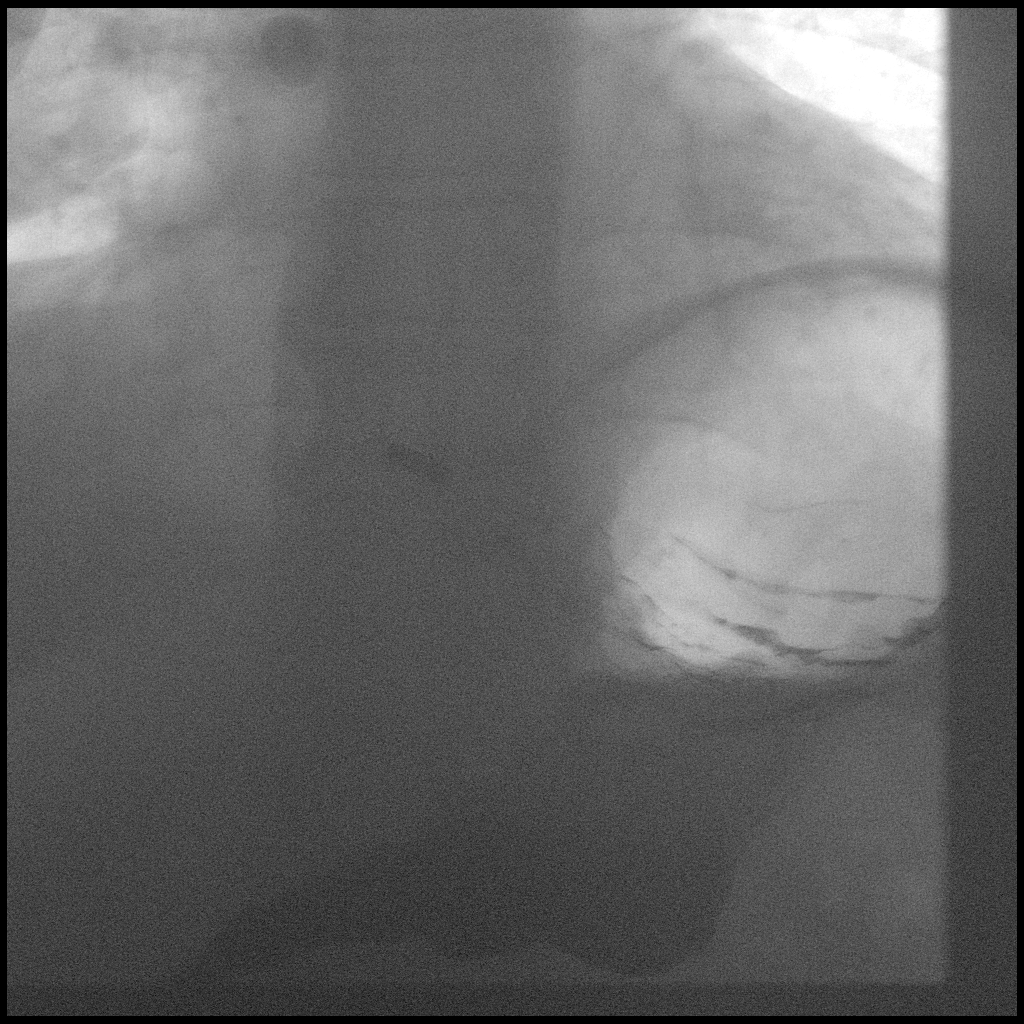

[Series 8: cp_standard · 0.17mm/px · 1 of 1 slices shown (7 of 7)]
[im 1/1]
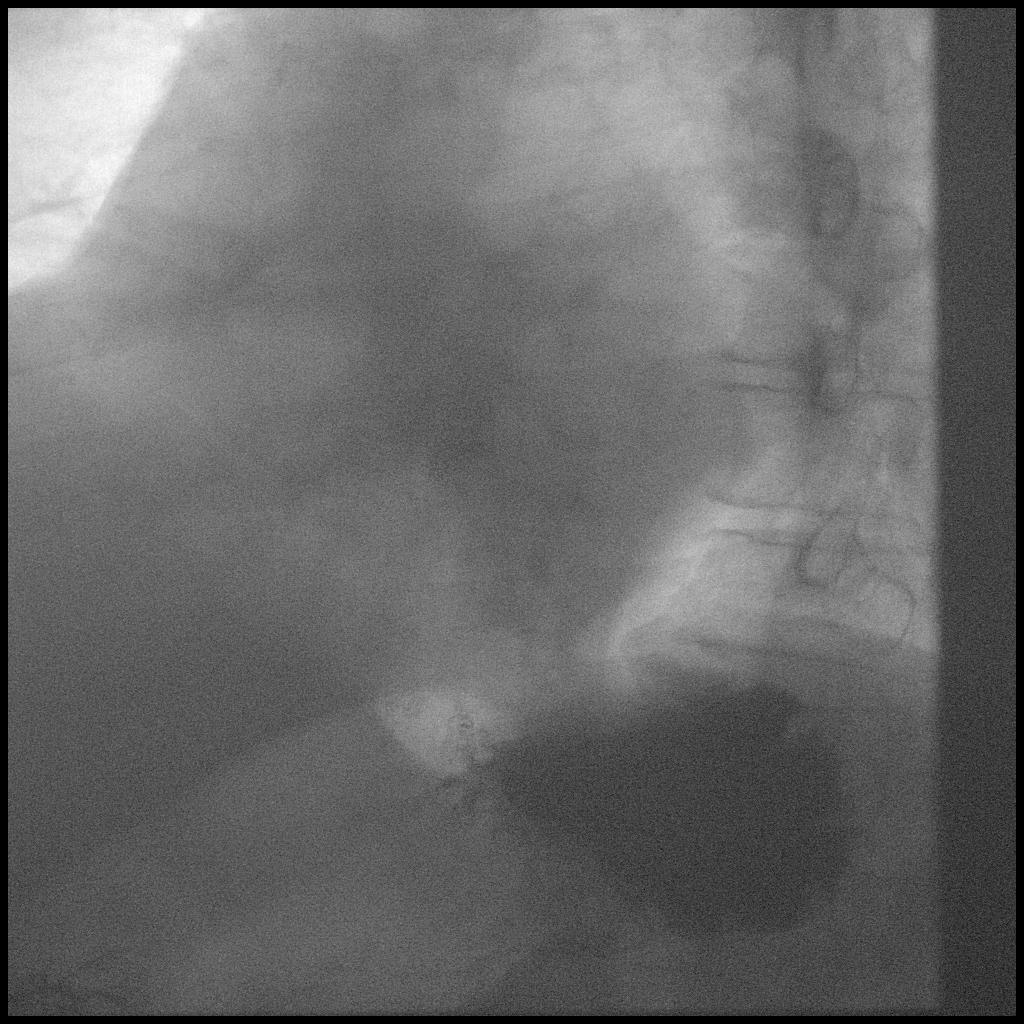

[14 of 23 positions shown; findings below may reference images not displayed]

FINDINGS: Patient complained of difficulty swallowing in the region of the
upper neck and pharynx. Examination of this area reveals normal
pharyngeal function. No stricture mass or diverticulum.

Esophageal mucosa and motility normal. No esophageal stricture or
mass.

Negative for hiatal hernia or reflux.

Barium tablet passed readily into the stomach without delay
IMPRESSION: Negative

## 2019-11-27 ENCOUNTER — Other Ambulatory Visit: Payer: Self-pay | Admitting: Physician Assistant

## 2019-11-27 DIAGNOSIS — M25562 Pain in left knee: Secondary | ICD-10-CM

## 2019-12-01 ENCOUNTER — Other Ambulatory Visit: Payer: Self-pay | Admitting: Physician Assistant

## 2019-12-08 ENCOUNTER — Ambulatory Visit
Admission: RE | Admit: 2019-12-08 | Discharge: 2019-12-08 | Disposition: A | Discharge status: Home / Self Care | Payer: BC Managed Care – PPO | Source: Ambulatory Visit | Attending: Physician Assistant | Admitting: Physician Assistant

## 2019-12-08 DIAGNOSIS — M25562 Pain in left knee: Secondary | ICD-10-CM

## 2020-06-27 NOTE — H&P (Signed)
Patient's anticipated LOS is less than 2 midnights, meeting these requirements: - Younger than 2 - Lives within 1 hour of care - Has a competent adult at home to recover with post-op recover - NO history of  - Chronic pain requiring opiods  - Diabetes  - Coronary Artery Disease  - Heart failure  - Heart attack  - Stroke  - DVT/VTE  - Cardiac arrhythmia  - Respiratory Failure/COPD  - Renal failure  - Anemia  - Advanced Liver disease       [name] is an 54 y.o. male.    Chief Complaint: left knee pain  HPI: Pt is a 54 y.o. male complaining of left knee pain for multiple months. Pain had continually increased since the beginning. X-rays in the clinic show meniscal tear left knee. Pt has tried various conservative treatments which have failed to alleviate their symptoms, including injections and therapy. Various options are discussed with the patient. Risks, benefits and expectations were discussed with the patient. Patient understand the risks, benefits and expectations and wishes to proceed with surgery.   PCP:  Hayden Rasmussen, MD  D/C Plans: Home  PMH: No past medical history on file.  PSH: Past Surgical History:  Procedure Laterality Date  . CHOLECYSTECTOMY N/A 11/08/2018   Procedure: LAPAROSCOPIC CHOLECYSTECTOMY WITH INTRAOPERATIVE CHOLANGIOGRAM;  Surgeon: Johnathan Hausen, MD;  Location: [facility];  Service: General;  Laterality: N/A;  . COLONOSCOPY WITH PROPOFOL N/A 03/24/2019   Procedure: COLONOSCOPY WITH PROPOFOL;  Surgeon: Carol Ada, MD;  Location: [facility];  Service: Endoscopy;  Laterality: N/A;  . POLYPECTOMY  03/24/2019   Procedure: POLYPECTOMY;  Surgeon: Carol Ada, MD;  Location: [facility] END[facility];  Service: Endoscopy;;  . ROTATOR CUFF REPAIR Left 1990    Social History:  reports that he has quit smoking. He has never used smokeless tobacco. He reports previous alcohol use. He reports previous drug use.  Allergies:  No Known  Allergies  Medications: No current facility-administered medications for this encounter.   Current Outpatient Medications  Medication Sig Dispense Refill  . b complex vitamins tablet Take 1 tablet by mouth daily.    . Betamethasone Dipropionate (SERNIVO) 0.05 % EMUL Apply 1 application topically daily as needed (rash).    . Cholecalciferol (VITAMIN D3) 250 MCG (10000 UT) TABS Take 10,000 Units by mouth daily.    Marland Kitchen gabapentin (NEURONTIN) 300 MG capsule Take 300 mg by mouth at bedtime.    . mometasone (NASONEX) 50 MCG/ACT nasal spray Place 1 spray into the nose daily as needed (allergies).      No results found for this or any previous visit (from the past 48 hour(s)). No results found.  ROS: Pain with rom of the left lower extremity  Physical Exam: Alert and oriented 54 y.o. male in no acute distress Cranial nerves 2-12 intact Cervical spine: full rom with no tenderness, nv intact distally Chest: active breath sounds bilaterally, no wheeze rhonchi or rales Heart: regular rate and rhythm, no murmur Abd: non tender non distended with active bowel sounds Hip is stable with rom  Left knee painful rom  Mild effusion Antalgic gait  Assessment/Plan Assessment: left knee meniscal tear  Plan:  Patient will undergo a left knee scope by Dr. Veverly Fells at [facility] Risks benefits and expectations were discussed with the patient. Patient understand risks, benefits and expectations and wishes to proceed. Preoperative templating of the joint replacement has been completed, documented, and submitted to the Operating Room personnel in order to optimize  intra-operative equipment management.   Brad Kyleena Scheirer PA-C, MPAS [facility] is now [facility]  [place] [address], [place], [place] [address] Phone: [phone] [email or web address] Facebook  Instagram  LinkedIn  Twitter    

## 2020-06-28 NOTE — Patient Instructions (Signed)
DUE TO COVID-19 ONLY ONE VISITOR IS ALLOWED TO COME WITH YOU AND STAY IN THE WAITING ROOM ONLY DURING PRE OP AND PROCEDURE DAY OF SURGERY. THE 1 VISITOR  MAY VISIT WITH YOU AFTER SURGERY IN YOUR PRIVATE ROOM DURING VISITING HOURS ONLY!  YOU NEED TO HAVE A COVID 19 TEST ON_9/7______ @_11 :00______, THIS TEST MUST BE DONE BEFORE SURGERY,  COVID TESTING SITE [place] [address], IT IS ON THE RIGHT GOING OUT [address] APPROXIMATELY  2 MINUTES PAST [employer] ON THE RIGHT. ONCE YOUR COVID TEST IS COMPLETED,  PLEASE BEGIN THE QUARANTINE INSTRUCTIONS AS OUTLINED IN YOUR HANDOUT.                [place]    Your procedure is scheduled on: 07/05/20   Report to [facility] Main  Entrance   Report to admitting at  10:30 AM     Call this number if you have problems the morning of surgery [place], [place].   No food after midnight.    You may have clear liquid until 9:30 AM.    At 9:30 AM drink pre surgery drink.   Nothing by mouth after 9:30 AM.    Take these medicines the morning of surgery with A SIP OF WATER: None                                 You may not have any metal on your body including              piercings  Do not wear jewelry, lotions, powders or deodorant                      Men may shave face and neck.   Do not bring valuables to the hospital. [place].  Contacts, dentures or bridgework may not be worn into surgery.      Patients discharged the day of surgery will not be allowed to drive home.   IF YOU ARE HAVING SURGERY AND GOING HOME THE SAME DAY, YOU MUST HAVE AN ADULT TO DRIVE YOU HOME AND BE WITH YOU FOR 24 HOURS.   YOU MAY GO HOME BY TAXI OR UBER OR ORTHERWISE, BUT AN ADULT MUST ACCOMPANY YOU HOME AND STAY WITH YOU FOR 24 HOURS.  Name and phone number of your  driver:  Special Instructions: N/A              Please read over the following fact sheets you were given: _____________________________________________________________________             [facility] - Preparing for Surgery  Before surgery, you can play an important role.   Because skin is not sterile, your skin needs to be as free of germs as possible.   You can reduce the number of germs on your skin by washing with CHG (chlorahexidine gluconate) soap before surgery.   CHG is an antiseptic cleaner which kills germs and bonds with the skin to continue killing germs even after washing. Please DO NOT use if you have an allergy to CHG or antibacterial soaps.   If your skin becomes reddened/irritated stop using the  CHG and inform your nurse when you arrive at Short Stay. r.  You may shave your face/neck.  Please follow these instructions carefully:  1.  Shower with CHG Soap the night before surgery and the  morning of Surgery.  2.  If you choose to wash your hair, wash your hair first as usual with your  normal  shampoo.  3.  After you shampoo, rinse your hair and body thoroughly to remove the  shampoo.                                        4.  Use CHG as you would any other liquid soap.  You can apply chg directly  to the skin and wash                       Gently with a scrungie or clean washcloth.  5.  Apply the CHG Soap to your body ONLY FROM THE NECK DOWN.   Do not use on face/ open                           Wound or open sores. Avoid contact with eyes, ears mouth and genitals (private parts).                       Wash face,  Genitals (private parts) with your normal soap.             6.  Wash thoroughly, paying special attention to the area where your surgery  will be performed.  7.  Thoroughly rinse your body with warm water from the neck down.  8.  DO NOT shower/wash with your normal soap after using and rinsing off  the CHG Soap.             9.  Pat yourself dry with a clean  towel.            10.  Wear clean pajamas.            11.  Place clean sheets on your bed the night of your first shower and do not  sleep with pets. Day of Surgery : Do not apply any lotions/deodorants the morning of surgery.  Please wear clean clothes to the hospital/surgery center.    Incentive Spirometer  An incentive spirometer is a tool that can help keep your lungs clear and active. This tool measures how well you are filling your lungs with each breath. Taking long deep breaths may help reverse or decrease the chance of developing breathing (pulmonary) problems (especially infection) following:  A long period of time when you are unable to move or be active. BEFORE THE PROCEDURE   If the spirometer includes an indicator to show your best effort, your nurse or respiratory therapist will set it to a desired goal.  If possible, sit up straight or lean slightly forward. Try not to slouch.  Hold the incentive spirometer in an upright position. INSTRUCTIONS FOR USE  1. Sit on the edge of your bed if possible, or sit up as far as you can in bed or on a chair. 2. Hold the incentive spirometer in an upright position. 3. Breathe out normally. 4. Place the mouthpiece in your mouth and seal your lips tightly around it. 5. Breathe in slowly and as  deeply as possible, raising the piston or the ball toward the top of the column. 6. Hold your breath for 3-5 seconds or for as long as possible. Allow the piston or ball to fall to the bottom of the column. 7. Remove the mouthpiece from your mouth and breathe out normally. 8. Rest for a few seconds and repeat Steps 1 through 7 at least 10 times every 1-2 hours when you are awake. Take your time and take a few normal breaths between deep breaths. 9. The spirometer may include an indicator to show your best effort. Use the indicator as a goal to work toward during each repetition. 10. After each set of 10 deep breaths, practice coughing to be sure  your lungs are clear. If you have an incision (the cut made at the time of surgery), support your incision when coughing by placing a pillow or rolled up towels firmly against it. Once you are able to get out of bed, walk around indoors and cough well. You may stop using the incentive spirometer when instructed by your caregiver.  RISKS AND COMPLICATIONS  Take your time so you do not get dizzy or light-headed.  If you are in pain, you may need to take or ask for pain medication before doing incentive spirometry. It is harder to take a deep breath if you are having pain. AFTER USE  Rest and breathe slowly and easily.  It can be helpful to keep track of a log of your progress. Your caregiver can provide you with a simple table to help with this. If you are using the spirometer at home, follow these instructions: [place] IF:   You are having difficultly using the spirometer.  You have trouble using the spirometer as often as instructed.  Your pain medication is not giving enough relief while using the spirometer.  You develop fever of 100.5 F (38.1 C) or higher. SEEK IMMEDIATE MEDICAL CARE IF:   You cough up bloody sputum that had not been present before.  You develop fever of 102 F (38.9 C) or greater.  You develop worsening pain at or near the incision site. MAKE SURE YOU:   Understand these instructions.  Will watch your condition.  Will get help right away if you are not doing well or get worse. Document Released: 02/22/2007 Document Revised: 01/04/2012 Document Reviewed: 04/25/2007 ExitCare Patient Information 2014 [place].   ________________________________________________________________________   FAILURE TO FOLLOW THESE INSTRUCTIONS MAY RESULT IN THE CANCELLATION OF YOUR SURGERY PATIENT SIGNATURE_________________________________  NURSE  SIGNATURE__________________________________  ________________________________________________________________________

## 2020-07-02 ENCOUNTER — Other Ambulatory Visit (HOSPITAL_COMMUNITY)
Admission: RE | Admit: 2020-07-02 | Discharge: 2020-07-02 | Disposition: A | Discharge status: Home / Self Care | Payer: BC Managed Care – PPO | Source: Ambulatory Visit | Attending: Orthopedic Surgery | Admitting: Orthopedic Surgery

## 2020-07-02 ENCOUNTER — Other Ambulatory Visit: Payer: Self-pay

## 2020-07-02 ENCOUNTER — Encounter (HOSPITAL_COMMUNITY)
Admission: RE | Admit: 2020-07-02 | Discharge: 2020-07-02 | Disposition: A | Discharge status: Home / Self Care | Payer: BC Managed Care – PPO | Source: Ambulatory Visit | Attending: Orthopedic Surgery | Admitting: Orthopedic Surgery

## 2020-07-02 ENCOUNTER — Encounter (HOSPITAL_COMMUNITY): Payer: Self-pay

## 2020-07-02 DIAGNOSIS — Z01812 Encounter for preprocedural laboratory examination: Secondary | ICD-10-CM | POA: Insufficient documentation

## 2020-07-02 DIAGNOSIS — Z20822 Contact with and (suspected) exposure to covid-19: Secondary | ICD-10-CM | POA: Insufficient documentation

## 2020-07-02 HISTORY — DX: Unspecified osteoarthritis, unspecified site: M19.90

## 2020-07-02 HISTORY — DX: Peripheral vascular disease, unspecified: I73.9

## 2020-07-02 HISTORY — DX: Sleep apnea, unspecified: G47.30

## 2020-07-02 LAB — CBC
HCT: 45.4 % (ref 39.0–52.0)
Hemoglobin: 15 g/dL (ref 13.0–17.0)
MCH: 30.5 pg (ref 26.0–34.0)
MCHC: 33 g/dL (ref 30.0–36.0)
MCV: 92.3 fL (ref 80.0–100.0)
Platelets: 236 10*3/uL (ref 150–400)
RBC: 4.92 MIL/uL (ref 4.22–5.81)
RDW: 13.2 % (ref 11.5–15.5)
WBC: 5.4 10*3/uL (ref 4.0–10.5)
nRBC: 0 % (ref 0.0–0.2)

## 2020-07-02 LAB — SARS CORONAVIRUS 2 (TAT 6-24 HRS): SARS Coronavirus 2: NEGATIVE

## 2020-07-02 NOTE — Progress Notes (Signed)
COVID Vaccine Completed:Yes Date COVID Vaccine completed:02/20/30 COVID vaccine manufacturer: Pfizer     PCP - Dr. Doristine Section Cardiologist - no  Chest x-ray - 2020 EKG - no Stress Test - no ECHO - no Cardiac Cath - no  Sleep Study - Yes CPAP - yes  Fasting Blood Sugar - NA Checks Blood Sugar _____ times a day  Blood Thinner Instructions:NA Aspirin Instructions: Last Dose:  Anesthesia review:   Patient denies shortness of breath, fever, cough and chest pain at PAT appointment yes   Patient verbalized understanding of instructions that were given to them at the PAT appointment. Patient was also instructed that they will need to review over the PAT instructions again at home before surgery. Yes  Pt has no SOB working or with ADLs, He is able to climb 3-4 flights of stairs

## 2020-07-04 MED ORDER — DEXTROSE 5 % IV SOLN
3.0000 g | INTRAVENOUS | Status: AC
  Administered 2020-07-05: 3 g via INTRAVENOUS
  Filled 2020-07-04: qty 3
  Filled 2020-07-04: qty 3000

## 2020-07-04 NOTE — Progress Notes (Signed)
Pt made aware to come in at 1000 instead of 1030 on 07/04/20. Stated understanding.

## 2020-07-05 ENCOUNTER — Encounter (HOSPITAL_COMMUNITY): Payer: Self-pay | Admitting: Orthopedic Surgery

## 2020-07-05 ENCOUNTER — Encounter (HOSPITAL_COMMUNITY)
Admission: RE | Disposition: A | Discharge status: Home / Self Care | Payer: Self-pay | Source: Home / Self Care | Attending: Orthopedic Surgery

## 2020-07-05 ENCOUNTER — Ambulatory Visit (HOSPITAL_COMMUNITY)
Admission: RE | Admit: 2020-07-05 | Discharge: 2020-07-05 | Disposition: A | Discharge status: Home / Self Care | Payer: BC Managed Care – PPO | Attending: Orthopedic Surgery | Admitting: Orthopedic Surgery

## 2020-07-05 ENCOUNTER — Ambulatory Visit (HOSPITAL_COMMUNITY): Payer: BC Managed Care – PPO | Admitting: Anesthesiology

## 2020-07-05 ENCOUNTER — Ambulatory Visit (HOSPITAL_COMMUNITY): Payer: BC Managed Care – PPO | Admitting: Physician Assistant

## 2020-07-05 DIAGNOSIS — M2242 Chondromalacia patellae, left knee: Secondary | ICD-10-CM | POA: Insufficient documentation

## 2020-07-05 DIAGNOSIS — S83232A Complex tear of medial meniscus, current injury, left knee, initial encounter: Secondary | ICD-10-CM | POA: Insufficient documentation

## 2020-07-05 DIAGNOSIS — Z87891 Personal history of nicotine dependence: Secondary | ICD-10-CM | POA: Diagnosis not present

## 2020-07-05 DIAGNOSIS — Z79899 Other long term (current) drug therapy: Secondary | ICD-10-CM | POA: Insufficient documentation

## 2020-07-05 DIAGNOSIS — M2342 Loose body in knee, left knee: Secondary | ICD-10-CM | POA: Insufficient documentation

## 2020-07-05 DIAGNOSIS — Z6841 Body Mass Index (BMI) 40.0 and over, adult: Secondary | ICD-10-CM | POA: Insufficient documentation

## 2020-07-05 DIAGNOSIS — X58XXXA Exposure to other specified factors, initial encounter: Secondary | ICD-10-CM | POA: Diagnosis not present

## 2020-07-05 DIAGNOSIS — M94262 Chondromalacia, left knee: Secondary | ICD-10-CM | POA: Diagnosis not present

## 2020-07-05 HISTORY — PX: KNEE ARTHROSCOPY: SHX127

## 2020-07-05 SURGERY — ARTHROSCOPY, KNEE
Anesthesia: General | Site: Knee | Laterality: Left

## 2020-07-05 MED ORDER — DEXAMETHASONE SODIUM PHOSPHATE 10 MG/ML IJ SOLN
INTRAMUSCULAR | Status: DC | PRN
  Administered 2020-07-05: 10 mg via INTRAVENOUS

## 2020-07-05 MED ORDER — BUPIVACAINE-EPINEPHRINE 0.25% -1:200000 IJ SOLN
INTRAMUSCULAR | Status: DC | PRN
  Administered 2020-07-05: 30 mL

## 2020-07-05 MED ORDER — FENTANYL CITRATE (PF) 100 MCG/2ML IJ SOLN
INTRAMUSCULAR | Status: AC
  Filled 2020-07-05: qty 2

## 2020-07-05 MED ORDER — PROMETHAZINE HCL 25 MG/ML IJ SOLN
INTRAMUSCULAR | Status: AC
  Filled 2020-07-05: qty 1

## 2020-07-05 MED ORDER — OXYCODONE-ACETAMINOPHEN 5-325 MG PO TABS: 1.0000 | ORAL_TABLET | ORAL | 0 refills | Status: AC | PRN

## 2020-07-05 MED ORDER — LACTATED RINGERS IV SOLN: INTRAVENOUS | Status: DC

## 2020-07-05 MED ORDER — MIDAZOLAM HCL 5 MG/5ML IJ SOLN
INTRAMUSCULAR | Status: DC | PRN
  Administered 2020-07-05: 2 mg via INTRAVENOUS

## 2020-07-05 MED ORDER — SODIUM CHLORIDE 0.9 % IR SOLN
Status: DC | PRN
  Administered 2020-07-05: 6000 mL

## 2020-07-05 MED ORDER — CHLORHEXIDINE GLUCONATE 0.12 % MT SOLN
15.0000 mL | Freq: Once | OROMUCOSAL | Status: AC
  Administered 2020-07-05: 15 mL via OROMUCOSAL

## 2020-07-05 MED ORDER — BUPIVACAINE-EPINEPHRINE (PF) 0.25% -1:200000 IJ SOLN
INTRAMUSCULAR | Status: AC
  Filled 2020-07-05: qty 30

## 2020-07-05 MED ORDER — ONDANSETRON HCL 4 MG PO TABS: 4.0000 mg | ORAL_TABLET | Freq: Three times a day (TID) | ORAL | 1 refills | Status: AC | PRN

## 2020-07-05 MED ORDER — ENOXAPARIN SODIUM 100 MG/ML ~~LOC~~ SOLN: 100.0000 mg | SUBCUTANEOUS | 0 refills | Status: DC

## 2020-07-05 MED ORDER — METHOCARBAMOL 500 MG PO TABS: 500.0000 mg | ORAL_TABLET | Freq: Three times a day (TID) | ORAL | 1 refills | Status: DC | PRN

## 2020-07-05 MED ORDER — PROPOFOL 10 MG/ML IV BOLUS
INTRAVENOUS | Status: AC
  Filled 2020-07-05: qty 20

## 2020-07-05 MED ORDER — ACETAMINOPHEN 500 MG PO TABS
1000.0000 mg | ORAL_TABLET | Freq: Once | ORAL | Status: AC
  Administered 2020-07-05: 1000 mg via ORAL
  Filled 2020-07-05: qty 2

## 2020-07-05 MED ORDER — FENTANYL CITRATE (PF) 100 MCG/2ML IJ SOLN
25.0000 ug | INTRAMUSCULAR | Status: DC | PRN
  Administered 2020-07-05 (×3): 50 ug via INTRAVENOUS

## 2020-07-05 MED ORDER — FENTANYL CITRATE (PF) 250 MCG/5ML IJ SOLN
INTRAMUSCULAR | Status: AC
  Filled 2020-07-05: qty 5

## 2020-07-05 MED ORDER — LIDOCAINE 2% (20 MG/ML) 5 ML SYRINGE
INTRAMUSCULAR | Status: DC | PRN
  Administered 2020-07-05: 100 mg via INTRAVENOUS

## 2020-07-05 MED ORDER — ONDANSETRON HCL 4 MG/2ML IJ SOLN
INTRAMUSCULAR | Status: DC | PRN
  Administered 2020-07-05: 4 mg via INTRAVENOUS

## 2020-07-05 MED ORDER — FENTANYL CITRATE (PF) 100 MCG/2ML IJ SOLN
INTRAMUSCULAR | Status: DC | PRN
Start: 2020-07-05 — End: 2020-07-05
  Administered 2020-07-05 (×2): 50 ug via INTRAVENOUS
  Administered 2020-07-05: 25 ug via INTRAVENOUS
  Administered 2020-07-05: 50 ug via INTRAVENOUS
  Administered 2020-07-05: 25 ug via INTRAVENOUS
  Administered 2020-07-05: 100 ug via INTRAVENOUS

## 2020-07-05 MED ORDER — ONDANSETRON HCL 4 MG/2ML IJ SOLN
INTRAMUSCULAR | Status: AC
  Filled 2020-07-05: qty 2

## 2020-07-05 MED ORDER — ORAL CARE MOUTH RINSE: 15.0000 mL | Freq: Once | OROMUCOSAL | Status: AC

## 2020-07-05 MED ORDER — MIDAZOLAM HCL 2 MG/2ML IJ SOLN
INTRAMUSCULAR | Status: AC
  Filled 2020-07-05: qty 2

## 2020-07-05 MED ORDER — PROPOFOL 10 MG/ML IV BOLUS
INTRAVENOUS | Status: DC | PRN
  Administered 2020-07-05: 300 mg via INTRAVENOUS

## 2020-07-05 MED ORDER — CELECOXIB 200 MG PO CAPS
200.0000 mg | ORAL_CAPSULE | Freq: Once | ORAL | Status: AC
  Administered 2020-07-05: 200 mg via ORAL
  Filled 2020-07-05: qty 1

## 2020-07-05 MED ORDER — PROMETHAZINE HCL 25 MG/ML IJ SOLN
6.2500 mg | INTRAMUSCULAR | Status: DC | PRN
  Administered 2020-07-05: 12.5 mg via INTRAVENOUS

## 2020-07-05 SURGICAL SUPPLY — 36 items
ABLATOR ASPIRATE 50D MULTI-PRT (SURGICAL WAND) ×2 IMPLANT
BNDG ELASTIC 4X5.8 VLCR STR LF (GAUZE/BANDAGES/DRESSINGS) ×2 IMPLANT
BNDG ELASTIC 6X15 VLCR STRL LF (GAUZE/BANDAGES/DRESSINGS) ×2 IMPLANT
BNDG ELASTIC 6X5.8 VLCR STR LF (GAUZE/BANDAGES/DRESSINGS) ×4 IMPLANT
BNDG GAUZE ELAST 4 BULKY (GAUZE/BANDAGES/DRESSINGS) ×4 IMPLANT
BURR OVAL 8 FLU 4.0X13 (MISCELLANEOUS) IMPLANT
COVER SURGICAL LIGHT HANDLE (MISCELLANEOUS) ×2 IMPLANT
DISSECTOR  3.8MM X 13CM (MISCELLANEOUS)
DISSECTOR 3.5MM X 13CM (MISCELLANEOUS) ×2 IMPLANT
DISSECTOR 3.8MM X 13CM (MISCELLANEOUS) IMPLANT
DISSECTOR 4.0MM X 13CM (MISCELLANEOUS) IMPLANT
DRAPE U-SHAPE 47X51 STRL (DRAPES) ×2 IMPLANT
DRSG PAD ABDOMINAL 8X10 ST (GAUZE/BANDAGES/DRESSINGS) ×4 IMPLANT
DURAPREP 26ML APPLICATOR (WOUND CARE) ×2 IMPLANT
ELECT REM PT RETURN 15FT ADLT (MISCELLANEOUS) ×2 IMPLANT
GAUZE SPONGE 4X4 12PLY STRL (GAUZE/BANDAGES/DRESSINGS) ×2 IMPLANT
GLOVE BIOGEL PI IND STRL 7.5 (GLOVE) ×1 IMPLANT
GLOVE BIOGEL PI INDICATOR 7.5 (GLOVE) ×1
GLOVE BIOGEL PI ORTHO PRO SZ8 (GLOVE) ×1
GLOVE ORTHO TXT STRL SZ7.5 (GLOVE) ×2 IMPLANT
GLOVE PI ORTHO PRO STRL SZ8 (GLOVE) ×1 IMPLANT
GLOVE SURG ORTHO 8.5 STRL (GLOVE) ×2 IMPLANT
GOWN STRL REUS W/TWL XL LVL3 (GOWN DISPOSABLE) ×2 IMPLANT
KIT BASIN OR (CUSTOM PROCEDURE TRAY) ×2 IMPLANT
KIT TURNOVER KIT A (KITS) IMPLANT
MANIFOLD NEPTUNE II (INSTRUMENTS) ×2 IMPLANT
PACK ARTHROSCOPY WL (CUSTOM PROCEDURE TRAY) ×2 IMPLANT
PADDING CAST COTTON 6X4 STRL (CAST SUPPLIES) ×4 IMPLANT
PENCIL SMOKE EVACUATOR (MISCELLANEOUS) IMPLANT
PORT APPOLLO RF 90DEGREE MULTI (SURGICAL WAND) IMPLANT
PROTECTOR NERVE ULNAR (MISCELLANEOUS) ×2 IMPLANT
STRIP CLOSURE SKIN 1/2X4 (GAUZE/BANDAGES/DRESSINGS) ×2 IMPLANT
SUT MNCRL AB 4-0 PS2 18 (SUTURE) ×2 IMPLANT
TOWEL OR 17X26 10 PK STRL BLUE (TOWEL DISPOSABLE) ×2 IMPLANT
TUBING ARTHROSCOPY IRRIG 16FT (MISCELLANEOUS) ×2 IMPLANT
WRAP KNEE MAXI GEL POST OP (GAUZE/BANDAGES/DRESSINGS) ×2 IMPLANT

## 2020-07-05 NOTE — Op Note (Signed)
NAME: Ok[place]o, [place] RECORD CZ:[id number] ACCOUNT [id number] DATE OF BIRTH:10/22/1966 FACILITY: [facility] LOCATION: [facility] PHYSICIAN:STEVEN Orlena Sheldon, MD  OPERATIVE REPORT  DATE OF PROCEDURE:  07/05/2020  PREOPERATIVE DIAGNOSIS:  Left knee medial meniscus tear, displaced.  POSTOPERATIVE DIAGNOSIS:  Left knee medial meniscus tear, displaced. 2.  Tricompartmental chondromalacia, grade III and IV.  PROCEDURE PERFORMED:   1.  Left knee arthroscopy with partial medial meniscectomy and chondroplasty with debridement technique in all 3 compartments. 2.  Removal of loose bodies.  ATTENDING SURGEON:  Esmond Plants, MD  ASSISTANT:  None.  ANESTHESIA:  LMA general anesthesia was used.  ESTIMATED BLOOD LOSS:  Minimal.  FLUID REPLACEMENT:  1000 mL crystalloid.  INSTRUMENT COUNTS:  Correct.  COMPLICATIONS:  No complications.  ANTIBIOTICS:  Perioperative antibiotics were given.  INDICATIONS:  The patient is a 54 year old male with a history of worsening left knee pain secondary to an MRI documented torn meniscus.  The patient has had a failure of conservative management.  The patient does have some chondromalacia present on MRI  with some edema concerning for worsening arthritis in the knee.  I discussed with the patient that we can certainly improve upon unstable cartilage and remove loose pieces of cartilage, but we cannot restore cartilage to areas where he has wear and tear.   The patient fully understands the limitations of arthroscopy, but would like to move forward, having failed conservative management with the intention to relieve pain and restore function to his knee in the near term.  Informed consent obtained.  DESCRIPTION OF PROCEDURE:  After an adequate level of anesthesia was achieved, the patient was positioned supine on the operating room table.  Left leg correctly identified.  We then used a lateral post.  A sterile prep and drape of the left leg  performed.   Time-out called, verifying correct patient, correct side.  We entered the knee using standard portals, including superolateral outflow, anterolateral scope and anteromedial working portals.  We identified significant tricompartmental  chondromalacia grade III and some areas of grade IV.  Loose flaps and fibrillation removed with tangential technique with a suction shaver.  A couple of small loose bodies identified and removed using suction shaver.  We entered the medial compartment.   There was a complex posterior horn medial meniscus tear with a combination of longitudinal tear on the undersurface, a concealed-type tear, as well as a horizontal cleavage tear.  We did do a partial meniscectomy with basket forceps and a motorized  shaver back to a smooth meniscal rim.  About half of the posterior horn of the meniscus had to be removed.  Meniscal root integrity was intact.  Weightbearing articular cartilage again some damaged and this was a pretty significant long the area,  measuring about 2 cm front to back and about a centimeter to 0.5 cm in width and it was grade III mostly, with a little bit of grade IV in some areas.    Again, loose flaps in the margins and we removed that with suction shaver, making sure everything  was stable.  The articular cartilage on the tibia looked pretty good with grade II changes, fibrillation only.  No chondroplasty required.  We went through the notch, ACL and PCL intact, into the lateral compartment and this really looked good with just  some load bearing articular cartilage chondromalacia on the tibia near the tibial spine.  We removed that using suction shaver.  The rest of the articular cartilage looked decent in the lateral compartment  and no meniscus tear identified.  Final lavage  of the knee joint.  We did remove  1 spur just medial to the ACL origin on the tibia and that was blocking our view a little bit in the medial compartment as we were scoping from the lateral  side over and so we removed that just for visualization and it  was abutting the ACL too, we took that out.  Once we completed our chondroplasty and our partial meniscectomy, we concluded surgery.  We sutured the wounds with 4-0 Monocryl, followed by Steri-Strips and a sterile compressive bandage.  The patient  tolerated surgery well.  VN/NUANCE  D:07/05/2020 T:07/05/2020 JOB:[id number]

## 2020-07-05 NOTE — Anesthesia Postprocedure Evaluation (Signed)
Anesthesia Post Note  Patient: [name]  Procedure(s) Performed: ARTHROSCOPY KNEE MEDIAL MENISCECTOMY AND CHONDROPLASTY (Left Knee)     Patient location during evaluation: PACU Anesthesia Type: General Level of consciousness: sedated Pain management: pain level controlled Vital Signs Assessment: post-procedure vital signs reviewed and stable Respiratory status: spontaneous breathing and respiratory function stable Cardiovascular status: stable Postop Assessment: no apparent nausea or vomiting Anesthetic complications: yes   Encounter Complications  Complication Outcome Phase Comment  Nausea  Intraprocedure     Last Vitals:  Vitals:   07/05/20 1540 07/05/20 1550  BP: 135/85 (!) 155/96  Pulse: 77 79  Resp: 18 12  Temp: 36.4 C 36.5 C  SpO2: 95% 98%    Last Pain:  Vitals:   07/05/20 1550  TempSrc: Axillary  PainSc:                  Pepe Mineau DANIEL

## 2020-07-05 NOTE — Brief Op Note (Signed)
07/05/2020  2:34 PM  PATIENT:  [name]  54 y.o. male  PRE-OPERATIVE DIAGNOSIS:  Left knee medial meniscus tear, displaced  POST-OPERATIVE DIAGNOSIS:  Left knee medial meniscus tear, displaced and chondromalacia of the knee joint  PROCEDURE:  Procedure(s): ARTHROSCOPY KNEE MEDIAL MENISCECTOMY AND CHONDROPLASTY (Left)   SURGEON:  Surgeon(s) and Role:    Netta Cedars, MD - Primary  PHYSICIAN ASSISTANT:   ASSISTANTS: none   ANESTHESIA:   general  EBL:  10 mL   BLOOD ADMINISTERED:none  DRAINS: none   LOCAL MEDICATIONS USED:  MARCAINE     SPECIMEN:  No Specimen  DISPOSITION OF SPECIMEN:  N/A  COUNTS:  YES  TOURNIQUET:  * No tourniquets in log *  DICTATION: .Other Dictation: Dictation Number [id number]  PLAN OF CARE: Discharge to home after PACU  PATIENT DISPOSITION:  PACU - hemodynamically stable.   Delay start of Pharmacological VTE agent (>24hrs) due to surgical blood loss or risk of bleeding: no

## 2020-07-05 NOTE — Discharge Instructions (Signed)
Ice to the knee constantly.  Keep the incision covered and clean and dry for one week, then ok to get it wet in the shower.  Do exercise as instructed every hour, please to prevent stiffness.    Ankle Pumps - up and down with the ankle Heel Slides - knee bending, slide your heel away from you and toward you, back and forth Straight Leg Raises  DO NOT prop anything under the knee, it will make your knee stiff.  Prop under the ankle to encourage your knee to go straight.   Use the walker while you are up and around for balance. Minimal weight this week, progress slowly next week.   Wear your support stockings 24/7 to prevent blood clots and start Lovenox injection subcutaneous, start tomorrow AM, for 30 days to prevent blood clots  Follow up with Dr Veverly Fells in two weeks in the office, call [phone] for appt

## 2020-07-05 NOTE — Anesthesia Procedure Notes (Signed)
Procedure Name: LMA Insertion Performed by: Gean Maidens, CRNA Pre-anesthesia Checklist: Patient identified, Emergency Drugs available, Suction available, Patient being monitored and Timeout performed Patient Re-evaluated:Patient Re-evaluated prior to induction Oxygen Delivery Method: Circle system utilized Preoxygenation: Pre-oxygenation with 100% oxygen Induction Type: IV induction Ventilation: Two handed mask ventilation required LMA: LMA inserted LMA Size: 5.0 Number of attempts: 1 Placement Confirmation: ETT inserted through vocal cords under direct vision,  positive ETCO2 and breath sounds checked- equal and bilateral Tube secured with: Tape Dental Injury: Teeth and Oropharynx as per pre-operative assessment

## 2020-07-05 NOTE — Interval H&P Note (Signed)
History and Physical Interval Note:  07/05/2020 12:40 PM  [name]  has presented today for surgery, with the diagnosis of Left knee medial meniscus tear.  The various methods of treatment have been discussed with the patient and family. After consideration of risks, benefits and other options for treatment, the patient has consented to  Procedure(s): ARTHROSCOPY KNEE (Left) as a surgical intervention.  The patient's history has been reviewed, patient examined, no change in status, stable for surgery.  I have reviewed the patient's chart and labs.  Questions were answered to the patient's satisfaction.     [name]

## 2020-07-05 NOTE — Transfer of Care (Signed)
Immediate Anesthesia Transfer of Care Note  Patient: [name]  Procedure(s) Performed: ARTHROSCOPY KNEE MEDIAL MENISCECTOMY AND CHONDROPLASTY (Left Knee)  Patient Location: PACU  Anesthesia Type:General  Level of Consciousness: awake, alert  and oriented  Airway & Oxygen Therapy: Patient Spontanous Breathing and Patient connected to face mask oxygen  Post-op Assessment: Report given to RN and Post -op Vital signs reviewed and stable  Post vital signs: Reviewed and stable  Last Vitals:  Vitals Value Taken Time  BP 172/95 07/05/20 1432  Temp    Pulse 85 07/05/20 1434  Resp 16 07/05/20 1434  SpO2 97 % 07/05/20 1434  Vitals shown include unvalidated device data.  Last Pain:  Vitals:   07/05/20 1026  TempSrc:   PainSc: 0-No pain         Complications: No complications documented.

## 2020-07-05 NOTE — Anesthesia Preprocedure Evaluation (Addendum)
Anesthesia Evaluation  Patient identified by MRN, date of birth, ID band Patient awake    Reviewed: Allergy & Precautions, NPO status , Patient's Chart, lab work & pertinent test results  History of Anesthesia Complications Negative for: history of anesthetic complications  Airway Mallampati: II  TM Distance: >3 FB Neck ROM: Full    Dental no notable dental hx. (+) Dental Advisory Given   Pulmonary neg pulmonary ROS, former smoker,    Pulmonary exam normal  (-) decreased breath sounds      Cardiovascular negative cardio ROS Normal cardiovascular exam     Neuro/Psych negative neurological ROS  negative psych ROS   GI/Hepatic negative GI ROS, Neg liver ROS,   Endo/Other  Morbid obesity  Renal/GU negative Renal ROS  negative genitourinary   Musculoskeletal  (+) Arthritis ,   Abdominal (+) + obese,   Peds negative pediatric ROS (+)  Hematology negative hematology ROS (+)   Anesthesia Other Findings   Reproductive/Obstetrics negative OB ROS                            Anesthesia Physical  Anesthesia Plan  ASA: III  Anesthesia Plan: General   Post-op Pain Management:    Induction: Intravenous  PONV Risk Score and Plan: 2 and Ondansetron and Dexamethasone  Airway Management Planned: LMA  Additional Equipment:   Intra-op Plan:   Post-operative Plan: Extubation in OR  Informed Consent: I have reviewed the patients History and Physical, chart, labs and discussed the procedure including the risks, benefits and alternatives for the proposed anesthesia with the patient or authorized representative who has indicated his/her understanding and acceptance.     Dental advisory given  Plan Discussed with: CRNA and Anesthesiologist  Anesthesia Plan Comments:        Anesthesia Quick Evaluation

## 2020-07-06 ENCOUNTER — Encounter (HOSPITAL_COMMUNITY): Payer: Self-pay | Admitting: Orthopedic Surgery

## 2021-06-27 ENCOUNTER — Other Ambulatory Visit: Payer: Self-pay | Admitting: Physician Assistant

## 2021-06-27 DIAGNOSIS — M25561 Pain in right knee: Secondary | ICD-10-CM

## 2021-07-08 ENCOUNTER — Ambulatory Visit
Admission: RE | Admit: 2021-07-08 | Discharge: 2021-07-08 | Disposition: A | Discharge status: Home / Self Care | Payer: BC Managed Care – PPO | Source: Ambulatory Visit | Attending: Physician Assistant | Admitting: Physician Assistant

## 2021-07-08 ENCOUNTER — Other Ambulatory Visit: Payer: Self-pay

## 2021-07-08 DIAGNOSIS — M25561 Pain in right knee: Secondary | ICD-10-CM

## 2021-08-21 ENCOUNTER — Other Ambulatory Visit: Payer: Self-pay

## 2021-08-21 ENCOUNTER — Encounter (HOSPITAL_COMMUNITY): Payer: Self-pay | Admitting: Orthopedic Surgery

## 2021-08-21 NOTE — Progress Notes (Signed)
Ordered Bari Bed with portable for procedure 08-22-21.

## 2021-08-21 NOTE — Progress Notes (Addendum)
COVID swab appointment: N/A  COVID Vaccine Completed:  Yes x3  Date COVID Vaccine completed: Has received booster:  Yes x1 COVID vaccine manufacturer: [employer]   Date of COVID positive in last 12 days:No  PCP - Horald Pollen, MD Cardiologist - N/A  Chest x-ray - N/A EKG - N/A Stress Test - N/A ECHO - N/A Cardiac Cath - N/A Pacemaker/ICD device last checked: Spinal Cord Stimulator:  Sleep Study - Yes, +sleep apnea CPAP - Yes  Fasting Blood Sugar - N/A Checks Blood Sugar _____ times a day  Blood Thinner Instructions:N/A Aspirin Instructions: Last Dose:  Activity level:  Can go up a flight of stairs and perform activities of daily living without stopping and without symptoms of chest pain or shortness of breath.    Anesthesia review: N/A  Patient denies shortness of breath, fever, cough and chest pain at PAT appointment (completed over the phone)   Patient verbalized understanding of instructions that were given to them at the PAT appointment. Patient was also instructed that they will need to review over the PAT instructions again at home before surgery.

## 2021-08-21 NOTE — H&P (Signed)
Patient's anticipated LOS is less than 2 midnights, meeting these requirements: - Younger than 55 - Lives within 1 hour of care - Has a competent adult at home to recover with post-op recover - NO history of  - Chronic pain requiring opiods  - Diabetes  - Coronary Artery Disease  - Heart failure  - Heart attack  - Stroke  - DVT/VTE  - Cardiac arrhythmia  - Respiratory Failure/COPD  - Renal failure  - Anemia  - Advanced Liver disease     [name] is an 55 y.o. male.    Chief Complaint: right knee pain  HPI: Pt is a 55 y.o. male complaining of right knee pain for multiple years. Pain had continually increased since the beginning. X-rays in the clinic show meniscal tear right knee . Pt has tried various conservative treatments which have failed to alleviate their symptoms, including injections and therapy. Various options are discussed with the patient. Risks, benefits and expectations were discussed with the patient. Patient understand the risks, benefits and expectations and wishes to proceed with surgery.   PCP:  Hayden Rasmussen, MD  D/C Plans: Home  PMH: Past Medical History:  Diagnosis Date   Arthritis    knee   Peripheral vascular disease ([place])    Sleep apnea     PSH: Past Surgical History:  Procedure Laterality Date   CHOLECYSTECTOMY N/A 11/08/2018   Procedure: LAPAROSCOPIC CHOLECYSTECTOMY WITH INTRAOPERATIVE CHOLANGIOGRAM;  Surgeon: Johnathan Hausen, MD;  Location: [facility];  Service: General;  Laterality: N/A;   COLONOSCOPY WITH PROPOFOL N/A 03/24/2019   Procedure: COLONOSCOPY WITH PROPOFOL;  Surgeon: Carol Ada, MD;  Location: [facility];  Service: Endoscopy;  Laterality: N/A;   KNEE ARTHROSCOPY Left 07/05/2020   Procedure: ARTHROSCOPY KNEE MEDIAL MENISCECTOMY AND CHONDROPLASTY;  Surgeon: Netta Cedars, MD;  Location: [facility];  Service: Orthopedics;  Laterality: Left;   POLYPECTOMY  03/24/2019   Procedure: POLYPECTOMY;  Surgeon: Carol Ada, MD;   Location: [facility];  Service: Endoscopy;;   ROTATOR CUFF REPAIR Left 1990    Social History:  reports that he quit smoking about 32 years ago. His smoking use included cigarettes. He has a 1.00 pack-year smoking history. He has never used smokeless tobacco. He reports current alcohol use of about 1.0 standard drink per week. He reports that he does not currently use drugs.  Allergies:  No Known Allergies  Medications: No current facility-administered medications for this encounter.   Current Outpatient Medications  Medication Sig Dispense Refill   acetaminophen (TYLENOL) 650 MG CR tablet Take 650 mg by mouth in the morning.     Betamethasone Dipropionate 0.05 % EMUL Apply 1 spray topically daily as needed (rash).     Cholecalciferol (VITAMIN D3) 250 MCG (10000 UT) TABS Take 10,000 Units by mouth in the morning.     Glucosamine-Chondroitin (COSAMIN DS PO) Take 1 tablet by mouth in the morning.     meloxicam (MOBIC) 15 MG tablet Take 15 mg by mouth in the morning.      No results found for this or any previous visit (from the past 48 hour(s)). No results found.  ROS: Pain with rom of the right lower extremity  Physical Exam: Alert and oriented 55 y.o. male in no acute distress Cranial nerves 2-12 intact Cervical spine: full rom with no tenderness, nv intact distally Chest: active breath sounds bilaterally, no wheeze rhonchi or rales Heart: regular rate and rhythm, no murmur Abd: non tender non distended with active bowel sounds  Hip is stable with rom  Right knee medial joint line tenderness Antalgic gait  Assessment/Plan Assessment: right knee mensical tear  Plan:  Patient will undergo a right knee scope by Dr. Veverly Fells at [facility] Risks benefits and expectations were discussed with the patient. Patient understand risks, benefits and expectations and wishes to proceed. Preoperative templating of the joint replacement has been completed, documented, and submitted to the  Operating Room personnel in order to optimize intra-operative equipment management.   Merla Riches PA-C, MPAS [facility] is now [employer] [address] [place], [place], [place] [address] Phone: [phone] [email or web address] Facebook  [employer]

## 2021-08-22 ENCOUNTER — Encounter (HOSPITAL_COMMUNITY): Payer: Self-pay | Admitting: Orthopedic Surgery

## 2021-08-22 ENCOUNTER — Ambulatory Visit (HOSPITAL_COMMUNITY)
Admission: RE | Admit: 2021-08-22 | Discharge: 2021-08-22 | Disposition: A | Discharge status: Home / Self Care | Payer: BC Managed Care – PPO | Source: Ambulatory Visit | Attending: Orthopedic Surgery | Admitting: Orthopedic Surgery

## 2021-08-22 ENCOUNTER — Ambulatory Visit (HOSPITAL_COMMUNITY): Payer: BC Managed Care – PPO | Admitting: Anesthesiology

## 2021-08-22 ENCOUNTER — Other Ambulatory Visit: Payer: Self-pay

## 2021-08-22 ENCOUNTER — Encounter (HOSPITAL_COMMUNITY)
Admission: RE | Disposition: A | Discharge status: Home / Self Care | Payer: Self-pay | Source: Ambulatory Visit | Attending: Orthopedic Surgery

## 2021-08-22 DIAGNOSIS — Z87891 Personal history of nicotine dependence: Secondary | ICD-10-CM | POA: Insufficient documentation

## 2021-08-22 DIAGNOSIS — Z791 Long term (current) use of non-steroidal anti-inflammatories (NSAID): Secondary | ICD-10-CM | POA: Insufficient documentation

## 2021-08-22 DIAGNOSIS — M94261 Chondromalacia, right knee: Secondary | ICD-10-CM | POA: Insufficient documentation

## 2021-08-22 DIAGNOSIS — X58XXXA Exposure to other specified factors, initial encounter: Secondary | ICD-10-CM | POA: Diagnosis not present

## 2021-08-22 DIAGNOSIS — S83231A Complex tear of medial meniscus, current injury, right knee, initial encounter: Secondary | ICD-10-CM | POA: Insufficient documentation

## 2021-08-22 DIAGNOSIS — M2241 Chondromalacia patellae, right knee: Secondary | ICD-10-CM | POA: Insufficient documentation

## 2021-08-22 HISTORY — PX: KNEE ARTHROSCOPY: SHX127

## 2021-08-22 LAB — GLUCOSE, CAPILLARY: Glucose-Capillary: 106 mg/dL — ABNORMAL HIGH (ref 70–99)

## 2021-08-22 SURGERY — ARTHROSCOPY, KNEE
Anesthesia: General | Site: Knee | Laterality: Right

## 2021-08-22 MED ORDER — CELECOXIB 200 MG PO CAPS
200.0000 mg | ORAL_CAPSULE | Freq: Once | ORAL | Status: AC
  Administered 2021-08-22: 200 mg via ORAL
  Filled 2021-08-22: qty 1

## 2021-08-22 MED ORDER — MIDAZOLAM HCL 2 MG/2ML IJ SOLN
INTRAMUSCULAR | Status: AC
  Filled 2021-08-22: qty 2

## 2021-08-22 MED ORDER — OXYCODONE-ACETAMINOPHEN 5-325 MG PO TABS: 1.0000 | ORAL_TABLET | ORAL | 0 refills | Status: AC | PRN

## 2021-08-22 MED ORDER — LABETALOL HCL 5 MG/ML IV SOLN
INTRAVENOUS | Status: DC | PRN
  Administered 2021-08-22: 5 mg via INTRAVENOUS

## 2021-08-22 MED ORDER — ONDANSETRON HCL 4 MG PO TABS: 4.0000 mg | ORAL_TABLET | Freq: Three times a day (TID) | ORAL | 1 refills | Status: AC | PRN

## 2021-08-22 MED ORDER — OXYCODONE HCL 5 MG PO TABS
ORAL_TABLET | ORAL | Status: AC
  Filled 2021-08-22: qty 1

## 2021-08-22 MED ORDER — ASPIRIN 81 MG PO CHEW: 81.0000 mg | CHEWABLE_TABLET | Freq: Two times a day (BID) | ORAL | 0 refills | Status: AC

## 2021-08-22 MED ORDER — PROPOFOL 10 MG/ML IV BOLUS
INTRAVENOUS | Status: DC | PRN
  Administered 2021-08-22: 300 mg via INTRAVENOUS

## 2021-08-22 MED ORDER — ACETAMINOPHEN 500 MG PO TABS
1000.0000 mg | ORAL_TABLET | Freq: Once | ORAL | Status: AC
  Administered 2021-08-22: 1000 mg via ORAL
  Filled 2021-08-22: qty 2

## 2021-08-22 MED ORDER — OXYCODONE HCL 5 MG/5ML PO SOLN: 5.0000 mg | Freq: Once | ORAL | Status: AC | PRN

## 2021-08-22 MED ORDER — AMISULPRIDE (ANTIEMETIC) 5 MG/2ML IV SOLN
10.0000 mg | Freq: Once | INTRAVENOUS | Status: AC
  Administered 2021-08-22: 10 mg via INTRAVENOUS

## 2021-08-22 MED ORDER — METHOCARBAMOL 500 MG PO TABS: 500.0000 mg | ORAL_TABLET | Freq: Four times a day (QID) | ORAL | 1 refills | Status: AC | PRN

## 2021-08-22 MED ORDER — FENTANYL CITRATE (PF) 250 MCG/5ML IJ SOLN
INTRAMUSCULAR | Status: DC | PRN
  Administered 2021-08-22 (×2): 50 ug via INTRAVENOUS
  Administered 2021-08-22: 100 ug via INTRAVENOUS
  Administered 2021-08-22 (×4): 50 ug via INTRAVENOUS
  Administered 2021-08-22: 100 ug via INTRAVENOUS

## 2021-08-22 MED ORDER — CEFAZOLIN IN SODIUM CHLORIDE 3-0.9 GM/100ML-% IV SOLN
3.0000 g | INTRAVENOUS | Status: AC
  Administered 2021-08-22: 3 g via INTRAVENOUS
  Filled 2021-08-22: qty 100

## 2021-08-22 MED ORDER — ORAL CARE MOUTH RINSE: 15.0000 mL | Freq: Once | OROMUCOSAL | Status: AC

## 2021-08-22 MED ORDER — PHENYLEPHRINE HCL-NACL 20-0.9 MG/250ML-% IV SOLN
INTRAVENOUS | Status: DC | PRN
  Administered 2021-08-22: 25 ug/min via INTRAVENOUS

## 2021-08-22 MED ORDER — LACTATED RINGERS IV SOLN: INTRAVENOUS | Status: DC

## 2021-08-22 MED ORDER — FENTANYL CITRATE PF 50 MCG/ML IJ SOSY
PREFILLED_SYRINGE | INTRAMUSCULAR | Status: AC
  Administered 2021-08-22: 25 ug via INTRAVENOUS
  Filled 2021-08-22: qty 2

## 2021-08-22 MED ORDER — FENTANYL CITRATE (PF) 250 MCG/5ML IJ SOLN
INTRAMUSCULAR | Status: AC
  Filled 2021-08-22: qty 5

## 2021-08-22 MED ORDER — FENTANYL CITRATE PF 50 MCG/ML IJ SOSY
PREFILLED_SYRINGE | INTRAMUSCULAR | Status: AC
  Administered 2021-08-22: 50 ug via INTRAVENOUS
  Filled 2021-08-22: qty 1

## 2021-08-22 MED ORDER — SODIUM CHLORIDE 0.9 % IR SOLN
Status: DC | PRN
  Administered 2021-08-22: 12000 mL

## 2021-08-22 MED ORDER — AMISULPRIDE (ANTIEMETIC) 5 MG/2ML IV SOLN
INTRAVENOUS | Status: AC
  Filled 2021-08-22: qty 4

## 2021-08-22 MED ORDER — CHLORHEXIDINE GLUCONATE 0.12 % MT SOLN
15.0000 mL | Freq: Once | OROMUCOSAL | Status: AC
  Administered 2021-08-22: 15 mL via OROMUCOSAL

## 2021-08-22 MED ORDER — ONDANSETRON HCL 4 MG/2ML IJ SOLN
INTRAMUSCULAR | Status: DC | PRN
  Administered 2021-08-22: 4 mg via INTRAVENOUS

## 2021-08-22 MED ORDER — KETOROLAC TROMETHAMINE 30 MG/ML IJ SOLN
INTRAMUSCULAR | Status: DC | PRN
  Administered 2021-08-22: 30 mg via INTRAVENOUS

## 2021-08-22 MED ORDER — DEXAMETHASONE SODIUM PHOSPHATE 10 MG/ML IJ SOLN
INTRAMUSCULAR | Status: DC | PRN
  Administered 2021-08-22: 10 mg via INTRAVENOUS

## 2021-08-22 MED ORDER — BUPIVACAINE-EPINEPHRINE 0.25% -1:200000 IJ SOLN
INTRAMUSCULAR | Status: DC | PRN
  Administered 2021-08-22: 30 mL

## 2021-08-22 MED ORDER — BUPIVACAINE-EPINEPHRINE (PF) 0.25% -1:200000 IJ SOLN
INTRAMUSCULAR | Status: AC
  Filled 2021-08-22: qty 30

## 2021-08-22 MED ORDER — LIDOCAINE 2% (20 MG/ML) 5 ML SYRINGE
INTRAMUSCULAR | Status: DC | PRN
  Administered 2021-08-22: 100 mg via INTRAVENOUS

## 2021-08-22 MED ORDER — MEPERIDINE HCL 50 MG/ML IJ SOLN: 6.2500 mg | INTRAMUSCULAR | Status: DC | PRN

## 2021-08-22 MED ORDER — FENTANYL CITRATE PF 50 MCG/ML IJ SOSY
25.0000 ug | PREFILLED_SYRINGE | INTRAMUSCULAR | Status: DC | PRN
  Administered 2021-08-22: 25 ug via INTRAVENOUS
  Administered 2021-08-22: 50 ug via INTRAVENOUS

## 2021-08-22 MED ORDER — OXYCODONE HCL 5 MG PO TABS: 5.0000 mg | ORAL_TABLET | Freq: Once | ORAL | Status: AC | PRN

## 2021-08-22 MED ORDER — PROMETHAZINE HCL 25 MG/ML IJ SOLN: 6.2500 mg | INTRAMUSCULAR | Status: DC | PRN

## 2021-08-22 MED ORDER — OXYCODONE HCL 5 MG PO TABS
ORAL_TABLET | ORAL | Status: AC
  Administered 2021-08-22: 5 mg via ORAL
  Filled 2021-08-22: qty 1

## 2021-08-22 MED ORDER — MIDAZOLAM HCL 2 MG/2ML IJ SOLN
INTRAMUSCULAR | Status: DC | PRN
  Administered 2021-08-22 (×2): 1 mg via INTRAVENOUS

## 2021-08-22 SURGICAL SUPPLY — 35 items
BNDG COHESIVE 6X5 TAN ST LF (GAUZE/BANDAGES/DRESSINGS) ×2 IMPLANT
BNDG ELASTIC 6X10 VLCR STRL LF (GAUZE/BANDAGES/DRESSINGS) ×2 IMPLANT
BNDG GAUZE ELAST 4 BULKY (GAUZE/BANDAGES/DRESSINGS) ×4 IMPLANT
BURR OVAL 8 FLU 4.0X13 (MISCELLANEOUS) IMPLANT
COVER SURGICAL LIGHT HANDLE (MISCELLANEOUS) IMPLANT
DISSECTOR 3.5MM X 13CM (MISCELLANEOUS) ×2 IMPLANT
DISSECTOR 4.0MM X 13CM (MISCELLANEOUS) ×2 IMPLANT
DRAPE ARTHROSCOPY W/POUCH 114 (DRAPES) ×2 IMPLANT
DRAPE ORTHO 2.5IN SPLIT 77X108 (DRAPES) ×2 IMPLANT
DRAPE ORTHO SPLIT 77X108 STRL (DRAPES) ×2
DRAPE U-SHAPE 47X51 STRL (DRAPES) ×2 IMPLANT
DRSG PAD ABDOMINAL 8X10 ST (GAUZE/BANDAGES/DRESSINGS) ×2 IMPLANT
DURAPREP 26ML APPLICATOR (WOUND CARE) ×2 IMPLANT
ELECT REM PT RETURN 15FT ADLT (MISCELLANEOUS) IMPLANT
GAUZE SPONGE 4X4 12PLY STRL (GAUZE/BANDAGES/DRESSINGS) ×2 IMPLANT
GLOVE SURG ORTHO LTX SZ7.5 (GLOVE) ×2 IMPLANT
GLOVE SURG ORTHO LTX SZ8.5 (GLOVE) ×2 IMPLANT
GLOVE SURG UNDER POLY LF SZ7.5 (GLOVE) ×4 IMPLANT
GLOVE SURG UNDER POLY LF SZ8.5 (GLOVE) ×2 IMPLANT
GOWN STRL REUS W/TWL XL LVL3 (GOWN DISPOSABLE) ×4 IMPLANT
KIT BASIN OR (CUSTOM PROCEDURE TRAY) ×2 IMPLANT
KIT TURNOVER KIT A (KITS) IMPLANT
MANIFOLD NEPTUNE II (INSTRUMENTS) ×2 IMPLANT
PACK ARTHROSCOPY WL (CUSTOM PROCEDURE TRAY) ×2 IMPLANT
PADDING CAST COTTON 6X4 STRL (CAST SUPPLIES) IMPLANT
PENCIL SMOKE EVACUATOR (MISCELLANEOUS) IMPLANT
PORT APPOLLO RF 90DEGREE MULTI (SURGICAL WAND) IMPLANT
PROBE APOLLO 90XL (SURGICAL WAND) ×2 IMPLANT
PROTECTOR NERVE ULNAR (MISCELLANEOUS) ×2 IMPLANT
STRIP CLOSURE SKIN 1/2X4 (GAUZE/BANDAGES/DRESSINGS) IMPLANT
SUT ETHILON 3 0 PS 1 (SUTURE) ×2 IMPLANT
SUT MNCRL AB 4-0 PS2 18 (SUTURE) IMPLANT
TOWEL OR 17X26 10 PK STRL BLUE (TOWEL DISPOSABLE) ×2 IMPLANT
TUBING ARTHROSCOPY IRRIG 16FT (MISCELLANEOUS) ×2 IMPLANT
WRAP KNEE MAXI GEL POST OP (GAUZE/BANDAGES/DRESSINGS) ×2 IMPLANT

## 2021-08-22 NOTE — Discharge Instructions (Addendum)
Ice to the knee constantly.  Keep the incision covered and clean and dry for one week, then ok to get it wet in the shower.  Do exercise as instructed every hour, please to prevent stiffness.    DO NOT prop anything under the knee, it will make your knee stiff.  Prop under the ankle or calf to elevate the leg.  Use the walker while you are up and around for balance, put only a little weight on the leg for the first week, then increase weight slowly the second week.  Wear your support stockings 24/7 to prevent blood clots and take baby aspirin twice daily for 30 days also to prevent blood clots, start tonight.   Follow up with Dr Veverly Fells in two weeks in the office, call [phone] for appt

## 2021-08-22 NOTE — Transfer of Care (Signed)
Immediate Anesthesia Transfer of Care Note  Patient: [name]  Procedure(s) Performed: ARTHROSCOPY KNEE PARITAL MEDIAL MENISECTOMY, LATERAL MENISCETOMY, CHONDROPLASTY (Right: Knee)  Patient Location: PACU  Anesthesia Type:General  Level of Consciousness: awake, alert , oriented and patient cooperative  Airway & Oxygen Therapy: Patient Spontanous Breathing and Patient connected to face mask oxygen  Post-op Assessment: Report given to RN, Post -op Vital signs reviewed and stable and Patient moving all extremities X 4  Post vital signs: stable  Last Vitals:  Vitals Value Taken Time  BP 126/68 08/22/21 1616  Temp    Pulse 80 08/22/21 1623  Resp 22 08/22/21 1623  SpO2 96 % 08/22/21 1623  Vitals shown include unvalidated device data.  Last Pain:  Vitals:   08/22/21 1224  TempSrc: Oral  PainSc:       Patients Stated Pain Goal: 5 (49/35/52 1747)  Complications: No notable events documented.

## 2021-08-22 NOTE — Interval H&P Note (Signed)
History and Physical Interval Note:  08/22/2021 2:15 PM  [name]  has presented today for surgery, with the diagnosis of right knee medial meniscus tear and lateral meniscus tear.  The various methods of treatment have been discussed with the patient and family. After consideration of risks, benefits and other options for treatment, the patient has consented to  Procedure(s): ARTHROSCOPY KNEE (Right) as a surgical intervention.  The patient's history has been reviewed, patient examined, no change in status, stable for surgery.  I have reviewed the patient's chart and labs.  Questions were answered to the patient's satisfaction.     [name]

## 2021-08-22 NOTE — Anesthesia Preprocedure Evaluation (Addendum)
Anesthesia Evaluation  Patient identified by MRN, date of birth, ID band Patient awake    Reviewed: Allergy & Precautions, NPO status , Patient's Chart, lab work & pertinent test results  History of Anesthesia Complications Negative for: history of anesthetic complications  Airway Mallampati: II  TM Distance: >3 FB Neck ROM: Full    Dental  (+) Dental Advisory Given, Teeth Intact   Pulmonary sleep apnea , former smoker,     (-) decreased breath sounds      Cardiovascular + Peripheral Vascular Disease  Normal cardiovascular exam     Neuro/Psych negative neurological ROS  negative psych ROS   GI/Hepatic negative GI ROS, Neg liver ROS,   Endo/Other  Morbid obesity  Renal/GU negative Renal ROS     Musculoskeletal  (+) Arthritis ,   Abdominal (+) + obese,   Peds  Hematology negative hematology ROS (+)   Anesthesia Other Findings   Reproductive/Obstetrics                           Anesthesia Physical Anesthesia Plan  ASA: 3  Anesthesia Plan: General   Post-op Pain Management:    Induction: Intravenous  PONV Risk Score and Plan: 2 and Ondansetron, Dexamethasone, Midazolam and Treatment may vary due to age or medical condition  Airway Management Planned: LMA  Additional Equipment: None  Intra-op Plan:   Post-operative Plan: Extubation in OR  Informed Consent: I have reviewed the patients History and Physical, chart, labs and discussed the procedure including the risks, benefits and alternatives for the proposed anesthesia with the patient or authorized representative who has indicated his/her understanding and acceptance.     Dental advisory given  Plan Discussed with: CRNA  Anesthesia Plan Comments:        Anesthesia Quick Evaluation

## 2021-08-22 NOTE — Anesthesia Procedure Notes (Signed)
Procedure Name: LMA Insertion Date/Time: 08/22/2021 3:03 PM Performed by: Cynda Familia, CRNA Pre-anesthesia Checklist: Patient identified, Emergency Drugs available, Suction available and Patient being monitored Patient Re-evaluated:Patient Re-evaluated prior to induction Oxygen Delivery Method: Circle System Utilized Preoxygenation: Pre-oxygenation with 100% oxygen Induction Type: IV induction Ventilation: Mask ventilation without difficulty LMA: LMA inserted and LMA with gastric port inserted LMA Size: 4.0 Number of attempts: 1 Placement Confirmation: positive ETCO2 Tube secured with: Tape Dental Injury: Teeth and Oropharynx as per pre-operative assessment  Comments: Smooth IV induction germeroth- LMA insertion AM CRNA atraumatic-- teeth and mouth as preop - bilat BS

## 2021-08-22 NOTE — Brief Op Note (Signed)
08/22/2021  4:13 PM  PATIENT:  Antonieta Loveless  55 y.o. male  PRE-OPERATIVE DIAGNOSIS:  right knee medial meniscus tear and lateral meniscus tear, chondromalacia Grade III/IV  POST-OPERATIVE DIAGNOSIS:  same  PROCEDURE:  Procedure(s): ARTHROSCOPY KNEE (Right) Partial medial and lateral meniscectomy  SURGEON:  Surgeon(s) and Role:    Netta Cedars, MD - Primary  PHYSICIAN ASSISTANT:   ASSISTANTS: Ventura Bruns, PA-C    ANESTHESIA:   general  EBL:  minimal  BLOOD ADMINISTERED:none  DRAINS: none   LOCAL MEDICATIONS USED:  MARCAINE     SPECIMEN:  No Specimen  DISPOSITION OF SPECIMEN:  N/A  COUNTS:  YES  TOURNIQUET:  * No tourniquets in log *  DICTATION: .Other Dictation: Dictation Number [id number]  PLAN OF CARE: Discharge to home after PACU  PATIENT DISPOSITION:  PACU - hemodynamically stable.   Delay start of Pharmacological VTE agent (>24hrs) due to surgical blood loss or risk of bleeding: no

## 2021-08-23 NOTE — Op Note (Signed)
[name], Terry Terry MEDICAL RECORD NO: [id number] ACCOUNT NO: [id number] DATE OF BIRTH: 06-01-1966 FACILITY: [facility] LOCATION: [facility] PHYSICIAN: Doran Heater. 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  PROCEDURE PERFORMED:  Right knee arthroscopy with partial medial and lateral meniscectomy as well as chondroplasty, debridement type, in all three compartments.  ATTENDING SURGEON:  Esmond Plants, MD  ASSISTANT:  Darol Destine, [place], who was scrubbed during the entire procedure and necessary for adequate positioning of the patient's leg during surgery. The patient was over 400 pounds and required additional assistant for appropriate positioning  and to facilitate the completion of surgery.  ANESTHESIA:  General anesthesia was used.  ESTIMATED BLOOD LOSS:  Minimal.  FLUID REPLACEMENT:  1500 mL crystalloid.  Instrument counts were correct.  There were no complications.  Perioperative antibiotics were given.  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  The patient has failed an extended period of conservative  management and presents for operative treatment to relieve pain and restore function.  The plan is for partial meniscectomy and chondroplasty.  Informed consent obtained.  DESCRIPTION OF PROCEDURE:  After an adequate level of anesthesia was achieved, the patient was positioned supine on the operating table.  The leg was kept up and we utilized a lateral post.  After sterile prep and drape of the right knee, a timeout was  called, verifying correct patient, correct site.  We entered the knee using standard portals, including superolateral  outflow, anterolateral scope and anteromedial working portals.  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  We performed a tangential chondroplasty, removing all unstable cartilage.  Patellar tracking seemed to be slightly lateral.  We  entered the medial compartment. There was a complex posterior horn meniscus tear that had combination of radial components as well as cleavage components and longitudinal components.  This was a complex pattern.  We performed partial meniscectomy with  basket forceps and motorized shaver.  This essentially represented a subtotal meniscectomy posteriorly and tapering into the mid body of the meniscus.  The anterior part looked normal and anterior meniscal root was intact.  There was significant  weightbearing articular cartilage damage in the medial compartment with deep wear and fibrillation and flaps. We debrided that using suction shaver down to stable articular cartilage.  The ACL and PCL intact.  Lateral compartment had a free edge tear  that was about 20% of the width of the meniscus.  We debrided that back to its stable base, which involved about 15-20% of meniscus being removed.  The remaining meniscal tissue was probed and felt to be stable. Meniscal root integrity intact.  Minimal  chondroplasty performed in the lateral compartment, mostly on the tibia. We did a final lavage of the knee joint, removed enough synovium, so we could see the entirety of the articular cartilage and made sure there were no other loose bodies identified.   After completion of our chondroplasty in three compartments and a partial medial and lateral meniscectomies, we concluded surgery, suturing the wounds with nylon followed by a sterile bandage.  I did inject  the knee with Marcaine postop and a sterile  bandage wrapping from the toes to  above the knee.  The patient was transported to recovery room in stable condition, having tolerated surgery well.   SHW D: 08/22/2021 4:22:27 pm T: 08/23/2021 1:57:00 am  JOB: [id number]/ [id number]

## 2021-08-25 NOTE — Anesthesia Postprocedure Evaluation (Signed)
Anesthesia Post Note  Patient: [name]  Procedure(s) Performed: ARTHROSCOPY KNEE PARITAL MEDIAL MENISECTOMY, LATERAL MENISCETOMY, CHONDROPLASTY (Right: Knee)     Patient location during evaluation: PACU Anesthesia Type: General Level of consciousness: awake and alert Pain management: pain level controlled Vital Signs Assessment: post-procedure vital signs reviewed and stable Respiratory status: spontaneous breathing, nonlabored ventilation, respiratory function stable and patient connected to nasal cannula oxygen Cardiovascular status: blood pressure returned to baseline and stable Postop Assessment: no apparent nausea or vomiting Anesthetic complications: no   No notable events documented.  Last Vitals:  Vitals:   08/22/21 1730 08/22/21 1745  BP: (!) 154/80 (!) 156/87  Pulse: 79   Resp: 19 15  Temp:  37 C  SpO2: 95% 96%    Last Pain:  Vitals:   08/22/21 1745  TempSrc: Oral  PainSc: 0-No pain                 Tiajuana Amass

## 2021-08-26 ENCOUNTER — Encounter (HOSPITAL_COMMUNITY): Payer: Self-pay | Admitting: Orthopedic Surgery

## 2024-06-06 ENCOUNTER — Other Ambulatory Visit: Payer: Self-pay | Admitting: Gastroenterology

## 2024-06-30 ENCOUNTER — Ambulatory Visit (HOSPITAL_COMMUNITY): Admit: 2024-06-30 | Admitting: Gastroenterology

## 2024-06-30 ENCOUNTER — Encounter (HOSPITAL_COMMUNITY): Payer: Self-pay

## 2024-06-30 SURGERY — COLONOSCOPY
Anesthesia: Monitor Anesthesia Care
